# Patient Record
Sex: Male | Born: 1970 | Race: Black or African American | Hispanic: No | Marital: Married | Smoking: Smoker, current status unknown
Health system: Southern US, Community
[De-identification: ages and names within clinical notes are randomized; demographics above are authoritative.]

## PROBLEM LIST (undated history)

## (undated) DIAGNOSIS — C8590 Non-Hodgkin lymphoma, unspecified, unspecified site: Secondary | ICD-10-CM

## (undated) DIAGNOSIS — I10 Essential (primary) hypertension: Secondary | ICD-10-CM

## (undated) DIAGNOSIS — I2510 Atherosclerotic heart disease of native coronary artery without angina pectoris: Secondary | ICD-10-CM

## (undated) HISTORY — PX: CARDIAC SURGERY: SHX584

## (undated) HISTORY — PX: KNEE SURGERY: SHX244

---

## 2017-03-07 ENCOUNTER — Observation Stay (HOSPITAL_COMMUNITY)
Admission: EM | Admit: 2017-03-07 | Discharge: 2017-03-10 | Disposition: A | Discharge status: Home / Self Care | Payer: Self-pay | Attending: Family Medicine | Admitting: Family Medicine

## 2017-03-07 ENCOUNTER — Emergency Department (HOSPITAL_COMMUNITY): Payer: Self-pay

## 2017-03-07 ENCOUNTER — Encounter (HOSPITAL_COMMUNITY): Payer: Self-pay

## 2017-03-07 DIAGNOSIS — N179 Acute kidney failure, unspecified: Secondary | ICD-10-CM | POA: Insufficient documentation

## 2017-03-07 DIAGNOSIS — D649 Anemia, unspecified: Secondary | ICD-10-CM | POA: Insufficient documentation

## 2017-03-07 DIAGNOSIS — I2510 Atherosclerotic heart disease of native coronary artery without angina pectoris: Secondary | ICD-10-CM | POA: Diagnosis present

## 2017-03-07 DIAGNOSIS — R072 Precordial pain: Principal | ICD-10-CM | POA: Insufficient documentation

## 2017-03-07 DIAGNOSIS — I252 Old myocardial infarction: Secondary | ICD-10-CM | POA: Insufficient documentation

## 2017-03-07 DIAGNOSIS — I25119 Atherosclerotic heart disease of native coronary artery with unspecified angina pectoris: Secondary | ICD-10-CM

## 2017-03-07 DIAGNOSIS — Z8249 Family history of ischemic heart disease and other diseases of the circulatory system: Secondary | ICD-10-CM | POA: Insufficient documentation

## 2017-03-07 DIAGNOSIS — F17200 Nicotine dependence, unspecified, uncomplicated: Secondary | ICD-10-CM | POA: Insufficient documentation

## 2017-03-07 DIAGNOSIS — E876 Hypokalemia: Secondary | ICD-10-CM | POA: Diagnosis present

## 2017-03-07 DIAGNOSIS — Z880 Allergy status to penicillin: Secondary | ICD-10-CM | POA: Insufficient documentation

## 2017-03-07 DIAGNOSIS — Z79899 Other long term (current) drug therapy: Secondary | ICD-10-CM | POA: Insufficient documentation

## 2017-03-07 DIAGNOSIS — R079 Chest pain, unspecified: Secondary | ICD-10-CM | POA: Diagnosis present

## 2017-03-07 DIAGNOSIS — Z955 Presence of coronary angioplasty implant and graft: Secondary | ICD-10-CM | POA: Insufficient documentation

## 2017-03-07 DIAGNOSIS — E785 Hyperlipidemia, unspecified: Secondary | ICD-10-CM | POA: Insufficient documentation

## 2017-03-07 DIAGNOSIS — Z9114 Patient's other noncompliance with medication regimen: Secondary | ICD-10-CM | POA: Insufficient documentation

## 2017-03-07 DIAGNOSIS — Z720 Tobacco use: Secondary | ICD-10-CM | POA: Diagnosis present

## 2017-03-07 DIAGNOSIS — I10 Essential (primary) hypertension: Secondary | ICD-10-CM | POA: Diagnosis present

## 2017-03-07 HISTORY — DX: Atherosclerotic heart disease of native coronary artery without angina pectoris: I25.10

## 2017-03-07 HISTORY — DX: Essential (primary) hypertension: I10

## 2017-03-07 HISTORY — DX: Non-Hodgkin lymphoma, unspecified, unspecified site: C85.90

## 2017-03-07 LAB — APTT: aPTT: 37 seconds — ABNORMAL HIGH (ref 24–36)

## 2017-03-07 LAB — COMPREHENSIVE METABOLIC PANEL
ALBUMIN: 3.4 g/dL — AB (ref 3.5–5.0)
ALT: 28 U/L (ref 17–63)
AST: 36 U/L (ref 15–41)
Alkaline Phosphatase: 91 U/L (ref 38–126)
Anion gap: 8 (ref 5–15)
BILIRUBIN TOTAL: 0.5 mg/dL (ref 0.3–1.2)
BUN: 9 mg/dL (ref 6–20)
CHLORIDE: 105 mmol/L (ref 101–111)
CO2: 26 mmol/L (ref 22–32)
CREATININE: 1.3 mg/dL — AB (ref 0.61–1.24)
Calcium: 8.8 mg/dL — ABNORMAL LOW (ref 8.9–10.3)
GFR calc Af Amer: >60 mL/min (ref >60)
GFR calc non Af Amer: >60 mL/min (ref >60)
GLUCOSE: 97 mg/dL (ref 65–99)
Potassium: 3.3 mmol/L — ABNORMAL LOW (ref 3.5–5.1)
Sodium: 139 mmol/L (ref 135–145)
Total Protein: 6.5 g/dL (ref 6.5–8.1)

## 2017-03-07 LAB — PROTIME-INR
INR: 1.15
Prothrombin Time: 14.8 seconds (ref 11.4–15.2)

## 2017-03-07 LAB — CBC
HEMATOCRIT: 34.4 % — AB (ref 39.0–52.0)
HEMOGLOBIN: 11.2 g/dL — AB (ref 13.0–17.0)
MCH: 25.5 pg — AB (ref 26.0–34.0)
MCHC: 32.6 g/dL (ref 30.0–36.0)
MCV: 78.4 fL (ref 78.0–100.0)
Platelets: 165 10*3/uL (ref 150–400)
RBC: 4.39 MIL/uL (ref 4.22–5.81)
RDW: 14.6 % (ref 11.5–15.5)
WBC: 6.5 10*3/uL (ref 4.0–10.5)

## 2017-03-07 LAB — I-STAT TROPONIN, ED
TROPONIN I, POC: 0.01 ng/mL (ref 0.00–0.08)
Troponin i, poc: 0 ng/mL (ref 0.00–0.08)
Troponin i, poc: 0.01 ng/mL (ref 0.00–0.08)

## 2017-03-07 LAB — MAGNESIUM: MAGNESIUM: 2.2 mg/dL (ref 1.7–2.4)

## 2017-03-07 LAB — TROPONIN I: Troponin I: <0.03 ng/mL (ref <0.03)

## 2017-03-07 MED ORDER — ENOXAPARIN SODIUM 40 MG/0.4ML ~~LOC~~ SOLN
40.0000 mg | SUBCUTANEOUS | Status: DC
  Administered 2017-03-07 – 2017-03-09 (×3): 40 mg via SUBCUTANEOUS
  Filled 2017-03-07 (×3): qty 0.4

## 2017-03-07 MED ORDER — POTASSIUM CHLORIDE CRYS ER 20 MEQ PO TBCR
40.0000 meq | EXTENDED_RELEASE_TABLET | Freq: Once | ORAL | Status: AC
  Administered 2017-03-07: 40 meq via ORAL
  Filled 2017-03-07: qty 2

## 2017-03-07 MED ORDER — NICOTINE 21 MG/24HR TD PT24
21.0000 mg | MEDICATED_PATCH | Freq: Every day | TRANSDERMAL | Status: DC
  Administered 2017-03-08: 21 mg via TRANSDERMAL
  Filled 2017-03-07 (×2): qty 1

## 2017-03-07 MED ORDER — ONDANSETRON HCL 4 MG/2ML IJ SOLN: 4.0000 mg | Freq: Four times a day (QID) | INTRAMUSCULAR | Status: DC | PRN

## 2017-03-07 MED ORDER — ACETAMINOPHEN 325 MG PO TABS: 650.0000 mg | ORAL_TABLET | ORAL | Status: DC | PRN

## 2017-03-07 MED ORDER — ASPIRIN 81 MG PO CHEW: 324.0000 mg | CHEWABLE_TABLET | Freq: Once | ORAL | Status: AC

## 2017-03-07 MED ORDER — MORPHINE SULFATE (PF) 2 MG/ML IV SOLN
2.0000 mg | INTRAVENOUS | Status: DC | PRN
  Administered 2017-03-07 – 2017-03-09 (×3): 2 mg via INTRAVENOUS
  Filled 2017-03-07 (×3): qty 1

## 2017-03-07 MED ORDER — NITROGLYCERIN 0.4 MG SL SUBL
0.4000 mg | SUBLINGUAL_TABLET | SUBLINGUAL | Status: DC | PRN
  Administered 2017-03-07: 0.4 mg via SUBLINGUAL
  Filled 2017-03-07: qty 1

## 2017-03-07 MED ORDER — METOPROLOL TARTRATE 25 MG PO TABS
25.0000 mg | ORAL_TABLET | Freq: Two times a day (BID) | ORAL | Status: DC
  Administered 2017-03-07 – 2017-03-08 (×3): 25 mg via ORAL
  Filled 2017-03-07 (×3): qty 1

## 2017-03-07 MED ORDER — GI COCKTAIL ~~LOC~~
30.0000 mL | Freq: Four times a day (QID) | ORAL | Status: DC | PRN
  Administered 2017-03-08: 30 mL via ORAL
  Filled 2017-03-07 (×2): qty 30

## 2017-03-07 NOTE — ED Notes (Signed)
Care handoff to [place], [place]

## 2017-03-07 NOTE — ED Notes (Signed)
ED Provider at bedside. 

## 2017-03-07 NOTE — ED Notes (Signed)
PT reports pain comes and goes. He doesn't feel like it improved with nitro, but also notes that is was "worse a few minutes ago."

## 2017-03-07 NOTE — ED Triage Notes (Signed)
Pt was shopping in [place] and had chest pain that feels like his last heart attack. Pt has had 324mg  ASA and 1nitro that helped his pain

## 2017-03-07 NOTE — H&P (Signed)
Peter Madden BSW:[id number] DOB: 1970-10-08 DOA: 03/07/2017     PCP: Patient, No Pcp Per   Outpatient Specialists: none, Haven't seen one for years Patient coming from: home Lives With family   Chief Complaint: chest pain   HPI: Peter Madden is a 46 y.o. male with medical history significant of CAD HTN, tobacco abuse,      Presented with chest pain while shopping at [place]. Similar to Prior heart attack. He has been having intermittent pain for a bout 1 week but getting worse and today it "doubled me over".  Feels sharp like heart burn.lasts 5-10 sec at a time. And then lets off and then comes back. No Nausea. Some shortness of breath.  Patient states that he had had a heart attack in the past and had stents placed at [facility] in [place] but not [place]. No information found in Epic Chest pain resolved with Nitro. Occasionally keeps coming back.    Regarding pertinent Chronic problems:reports hx of MI and CAD sp stenting in 2014   IN ER:  Temp (24hrs), Avg:98.6 F (37 C), Min:98.6 F (37 C), Max:98.6 F (37 C)      on arrival  ED Triage Vitals  Enc Vitals Group     BP 03/07/17 1610 127/71     Pulse Rate 03/07/17 1610 88     Resp 03/07/17 1610 16     Temp 03/07/17 1610 98.6 F (37 C)     Temp Source 03/07/17 1610 Oral     SpO2 03/07/17 1559 100 %     Weight 03/07/17 1645 220 lb (99.8 kg)     Height 03/07/17 1645 6\' 1"  (1.854 m)     Head Circumference --      Peak Flow --      Pain Score 03/07/17 1610 5     Pain Loc --      Pain Edu? --      Excl. in [place]? --    RR 13 HR 78 BP 163/96 Trop 0.00-> 0.001  NA 139 K 3.3 Cr 1.30 alb 3.4  INr 1.15 WBC 6.5 Hg 11.2  CXR cardiomegaly Following Medications were ordered in ER: Medications  nitroGLYCERIN (NITROSTAT) SL tablet 0.4 mg (0.4 mg Sublingual Given 03/07/17 1644)  aspirin chewable tablet 324 mg (324 mg Oral Given by EMS 03/07/17 1645)     ER provider discussed case with:  Cardiology who felt EKG  changes were most likely secondary to early repolarization  Hospitalist was called for admission for chest pain evaluation  Review of Systems:    Pertinent positives include: chest pain, night sweats, blurred vision increased thirst and urination  Constitutional:  No weight loss,  Fevers, chills, fatigue, weight loss  HEENT:  No headaches, Difficulty swallowing,Tooth/dental problems,Sore throat,  No sneezing, itching, ear ache, nasal congestion, post nasal drip,  Cardio-vascular:  No Orthopnea, PND, anasarca, dizziness, palpitations.no Bilateral lower extremity swelling  GI:  No heartburn, indigestion, abdominal pain, nausea, vomiting, diarrhea, change in bowel habits, loss of appetite, melena, blood in stool, hematemesis Resp:  no shortness of breath at rest. No dyspnea on exertion, No excess mucus, no productive cough, No non-productive cough, No coughing up of blood.No change in color of mucus.No wheezing. Skin:  no rash or lesions. No jaundice GU:  no dysuria, change in color of urine, no urgency or frequency. No straining to urinate.  No flank pain.  Musculoskeletal:  No joint pain or no joint swelling. No decreased range of motion. No  back pain.  Psych:  No change in mood or affect. No depression or anxiety. No memory loss.  Neuro: no localizing neurological complaints, no tingling, no weakness, no double vision, no gait abnormality, no slurred speech, no confusion  As per HPI otherwise 10 point review of systems negative.   Past Medical History: Past Medical History:  Diagnosis Date  . Coronary artery disease   . Heart attack ([place]) 2014  . Lymphoma [facility])    Past Surgical History:  Procedure Laterality Date  . CARDIAC SURGERY       Social History:  Ambulatory  independently      reports that he has been smoking.  He has never used smokeless tobacco. He reports that he does not drink alcohol or use drugs.  Allergies:   Allergies  Allergen Reactions  .  Penicillins Hives    Has patient had a PCN reaction causing immediate rash, facial/tongue/throat swelling, SOB or lightheadedness with hypotension: Yes Has patient had a PCN reaction causing severe rash involving mucus membranes or skin necrosis: No Has patient had a PCN reaction that required hospitalization: No Has patient had a PCN reaction occurring within the last 10 years: No If all of the above answers are "NO", then may proceed with Cephalosporin use.       Family History:   Family History  Problem Relation Age of Onset  . Diabetes Mother   . CAD Father   . Testicular cancer Father   . CAD Other   . Stroke Other   . Brain cancer Other     Medications: Prior to Admission medications   Medication Sig Start Date End Date Taking? Authorizing Provider  albuterol (PROVENTIL HFA;VENTOLIN HFA) 108 (90 Base) MCG/ACT inhaler Inhale 2 puffs into the lungs every 6 (six) hours as needed for wheezing or shortness of breath.   Yes [provider]  lisinopril (PRINIVIL,ZESTRIL) 20 MG tablet Take 20 mg by mouth daily.   Yes [provider]  Multiple Vitamin (MULTIVITAMIN WITH MINERALS) TABS tablet Take 1 tablet by mouth daily.   Yes [provider]    Physical Exam: Patient Vitals for the past 24 hrs:  BP Temp Temp src Pulse Resp SpO2 Height Weight  03/07/17 1830 (!) 142/84 - - - 13 - - -  03/07/17 1815 (!) 148/88 - - - 14 - - -  03/07/17 1800 129/86 - - - 13 - - -  03/07/17 1745 (!) 157/93 - - - 14 - - -  03/07/17 1741 - - - - - 97 % - -  03/07/17 1730 (!) 154/96 - - - (!) 24 - - -  03/07/17 1715 (!) 139/94 - - - 10 - - -  03/07/17 1700 (!) 127/95 - - - 12 - - -  03/07/17 1645 - - - - - - 6\' 1"  (1.854 m) 99.8 kg (220 lb)  03/07/17 1645 126/80 - - - 18 - - -  03/07/17 1632 - - - - (!) 21 - - -  03/07/17 1631 (!) 143/84 - - - - - - -  03/07/17 1610 127/71 98.6 F (37 C) Oral 88 16 100 % - -  03/07/17 1559 - - - - - 100 % - -    1. General:  in No  Acute distress 2. Psychological: Alert and  Oriented 3. Head/ENT:   Moist   Mucous Membranes  Head Non traumatic, neck supple                            Poor Dentition 4. SKIN:  decreased Skin turgor,  Skin clean Dry and intact no rash 5. Heart: Regular rate and rhythm no Murmur, Rub or gallop 6. Lungs:   no wheezes or crackles   7. Abdomen: Soft, non-tender, Non distended 8. Lower extremities: no clubbing, cyanosis, or edema 9. Neurologically Grossly intact, moving all 4 extremities equally   10. MSK: Normal range of motion, pain not reproducible by palpation   body mass index is 29.03 kg/m.  Labs on Admission:   Labs on Admission: I have personally reviewed following labs and imaging studies  CBC:  Recent Labs Lab 03/07/17 1613  WBC 6.5  HGB 11.2*  HCT 34.4*  MCV 78.4  PLT 824   Basic Metabolic Panel:  Recent Labs Lab 03/07/17 1622  NA 139  K 3.3*  CL 105  CO2 26  GLUCOSE 97  BUN 9  CREATININE 1.30*  CALCIUM 8.8*   GFR: Estimated Creatinine Clearance: 89.2 mL/min (A) (by C-G formula based on SCr of 1.3 mg/dL (H)). Liver Function Tests:  Recent Labs Lab 03/07/17 1622  AST 36  ALT 28  ALKPHOS 91  BILITOT 0.5  PROT 6.5  ALBUMIN 3.4*   No results for input(s): LIPASE, AMYLASE in the last 168 hours. No results for input(s): AMMONIA in the last 168 hours. Coagulation Profile:  Recent Labs Lab 03/07/17 1622  INR 1.15   Cardiac Enzymes: No results for input(s): CKTOTAL, CKMB, CKMBINDEX, TROPONINI in the last 168 hours. BNP (last 3 results) No results for input(s): PROBNP in the last 8760 hours. HbA1C: No results for input(s): HGBA1C in the last 72 hours. CBG: No results for input(s): GLUCAP in the last 168 hours. Lipid Profile: No results for input(s): CHOL, HDL, LDLCALC, TRIG, CHOLHDL, LDLDIRECT in the last 72 hours. Thyroid Function Tests: No results for input(s): TSH, T4TOTAL, FREET4, T3FREE, THYROIDAB in the last 72  hours. Anemia Panel: No results for input(s): VITAMINB12, FOLATE, FERRITIN, TIBC, IRON, RETICCTPCT in the last 72 hours. Urine analysis: No results found for: COLORURINE, APPEARANCEUR, LABSPEC, PHURINE, GLUCOSEU, HGBUR, BILIRUBINUR, KETONESUR, PROTEINUR, UROBILINOGEN, NITRITE, LEUKOCYTESUR Sepsis Labs: @LABRCNTIP (procalcitonin:4,lacticidven:4) )No results found for this or any previous visit (from the past 240 hour(s)).     UA  not ordered  No results found for: HGBA1C  Estimated Creatinine Clearance: 89.2 mL/min (A) (by C-G formula based on SCr of 1.3 mg/dL (H)).  BNP (last 3 results) No results for input(s): PROBNP in the last 8760 hours.   ECG REPORT  Independently reviewed Rate:81  Rhythm: Sinus ST&T Change: Early repolarization QTC 457  Filed Weights   03/07/17 1645  Weight: 99.8 kg (220 lb)     Cultures: No results found for: SDES, SPECREQUEST, CULT, REPTSTATUS   Radiological Exams on Admission: Dg Chest Portable 1 View  Result Date: 03/07/2017 CLINICAL DATA:  Chest pain. EXAM: PORTABLE CHEST 1 VIEW COMPARISON:  No prior. FINDINGS: Thoracic aorta is tortuous. Mild cardiomegaly. Normal pulmonary vascularity. No focal infiltrate. No pleural effusion or pneumothorax. Left costophrenic angle is not completely imaged. No acute bony abnormality. IMPRESSION: Thoracic aorta is slightly tortuous. Borderline cardiomegaly. No pulmonary venous congestion. No acute pulmonary disease. Electronically Signed   By: Marcello Moores  Register   On: 03/07/2017 17:28    Chart has been reviewed    Assessment/Plan  46 y.o. male  with medical history significant of CAD HTN, tobacco abuse,   Admitted for chest pain evaluation  Present on Admission: . CAD (coronary artery disease) - continue aspirin, will benefit from further work up . Chest pain - - given risk factors will admit, monitor on telemetry, cycle cardiac enzymes, obtain serial ECG. Further risk stratify with lipid panel, hgA1C, obtain  TSH. Make sure patient is on Aspirin. Further treatment based on the currently pending results.   . Hypokalemia replace and check mg level HTN- poorly controled, he has not been taking his medications as prescribed.  . Tobacco abuse - spoke about importance of quitting AKI - elevated creatinine unsure what the baseline is, will gently rehydrate and follow Anemia- check anemia panel in AM,  Other plan as per orders.  DVT prophylaxis:  Lovenox     Code Status:  FULL CODE   as per patient    Family Communication:   Family not at  Bedside   Disposition Plan:     To home once workup is complete and patient is stable                        Consults called: email cardiology   Admission status:    obs   Level of care     tele          I have spent a total of 56 min on this admission    Brinleigh Tew 03/07/2017, 8:04 PM    [facility]  Pager [phone]   after 2 AM please page floor coverage PA If 7AM-7PM, please contact the day team taking care of the patient  [email or web address]  Password [email or web address]

## 2017-03-07 NOTE — ED Provider Notes (Signed)
[place] DEPT Provider Note   CSN: [id number] Arrival date & time: 03/07/17  1558     History   Chief Complaint Chief Complaint  Patient presents with  . Chest Pain    HPI Peter Madden is a 46 y.o. male.  HPI  46 year old man reports history of MI presents today complaining of substernal chest pain. He states he has had some chest discomfort that he thought was indigestion over the past week. Has waxed and waned. It comes and goes he is unable to give me a timeframe. He states it began again last night and worsened this afternoon. The worst is 9 out of 10 and is currently 6-7 out of 10. He describes some dyspnea but no diaphoresis. He states this is similar to previous MI. Aspirin and nitroglycerin in route with some improvement of his symptoms. He states his previous MI and was treated at [facility]. He states that he has since moved. He denies any cough, fever, history of PE or DVT or leg swelling currently. He is a smoker but denies any drug use.  Past Medical History:  Diagnosis Date  . Coronary artery disease   . Heart attack ([place]) 2014  . Lymphoma ([place])     There are no active problems to display for this patient.   Past Surgical History:  Procedure Laterality Date  . CARDIAC SURGERY         Home Medications    Prior to Admission medications   Medication Sig Start Date End Date Taking? Authorizing Provider  albuterol (PROVENTIL HFA;VENTOLIN HFA) 108 (90 Base) MCG/ACT inhaler Inhale 2 puffs into the lungs every 6 (six) hours as needed for wheezing or shortness of breath.   Yes [provider]  lisinopril (PRINIVIL,ZESTRIL) 20 MG tablet Take 20 mg by mouth daily.   Yes [provider]  Multiple Vitamin (MULTIVITAMIN WITH MINERALS) TABS tablet Take 1 tablet by mouth daily.   Yes [provider]    Family History No family history on file.  Social History Social History  Substance Use Topics  . Smoking status: Smoker,  Current Status Unknown  . Smokeless tobacco: Never Used  . Alcohol use No     Allergies   Penicillins   Review of Systems Review of Systems  All other systems reviewed and are negative.    Physical Exam Updated Vital Signs BP (!) 157/93   Pulse 88   Temp 98.6 F (37 C) (Oral)   Resp 14   Ht 1.854 m (6\' 1" )   Wt 99.8 kg (220 lb)   SpO2 97%   BMI 29.03 kg/m   Physical Exam  Constitutional: He is oriented to person, place, and time. He appears well-developed and well-nourished.  HENT:  Head: Normocephalic and atraumatic.  Right Ear: External ear normal.  Left Ear: External ear normal.  Nose: Nose normal.  Mouth/Throat: Oropharynx is clear and moist.  Eyes: Conjunctivae and EOM are normal. Pupils are equal, round, and reactive to light.  Neck: Normal range of motion. Neck supple.  Cardiovascular: Normal rate, regular rhythm, normal heart sounds and intact distal pulses.   Pulmonary/Chest: Effort normal and breath sounds normal.  Abdominal: Soft. Bowel sounds are normal.  Musculoskeletal: Normal range of motion.  Neurological: He is alert and oriented to person, place, and time. He has normal reflexes.  Skin: Skin is warm and dry.  Psychiatric: He has a normal mood and affect. His behavior is normal. Judgment and thought content normal.  Nursing note  and vitals reviewed.    ED Treatments / Results  Labs (all labs ordered are listed, but only abnormal results are displayed) Labs Reviewed  CBC - Abnormal; Notable for the following:       Result Value   Hemoglobin 11.2 (*)    HCT 34.4 (*)    MCH 25.5 (*)    All other components within normal limits  COMPREHENSIVE METABOLIC PANEL - Abnormal; Notable for the following:    Potassium 3.3 (*)    Creatinine, Ser 1.30 (*)    Calcium 8.8 (*)    Albumin 3.4 (*)    All other components within normal limits  APTT - Abnormal; Notable for the following:    aPTT 37 (*)    All other components within normal limits    PROTIME-INR  I-STAT TROPOININ, ED  CBG MONITORING, ED  I-STAT TROPOININ, ED    EKG  EKG Interpretation None       Radiology Dg Chest Portable 1 View  Result Date: 03/07/2017 CLINICAL DATA:  Chest pain. EXAM: PORTABLE CHEST 1 VIEW COMPARISON:  No prior. FINDINGS: Thoracic aorta is tortuous. Mild cardiomegaly. Normal pulmonary vascularity. No focal infiltrate. No pleural effusion or pneumothorax. Left costophrenic angle is not completely imaged. No acute bony abnormality. IMPRESSION: Thoracic aorta is slightly tortuous. Borderline cardiomegaly. No pulmonary venous congestion. No acute pulmonary disease. Electronically Signed   By: Marcello Moores  Register   On: 03/07/2017 17:28    Procedures Procedures (including critical care time)  Medications Ordered in ED Medications  nitroGLYCERIN (NITROSTAT) SL tablet 0.4 mg (0.4 mg Sublingual Given 03/07/17 1644)  aspirin chewable tablet 324 mg (324 mg Oral Given by EMS 03/07/17 1645)     Initial Impression / Assessment and Plan / ED Course  I have reviewed the triage vital signs and the nursing notes.  Pertinent labs & imaging results that were available during my care of the patient were reviewed by me and considered in my medical decision making (see chart for details).    After initial evaluation, EKG discussed with Delos Haring PA. She reports that she discussed results with Dr. Johnsie Cancel who reviewed EKG. He feels that this is early re-polarization.   Discussed with Dr. detected and will admit for ongoing evaluation of chest pain Final Clinical Impressions(s) / ED Diagnoses   Final diagnoses:  Chest pain, unspecified type    New Prescriptions New Prescriptions   No medications on file     Pattricia Boss, MD 03/07/17 1944

## 2017-03-08 ENCOUNTER — Telehealth: Payer: Self-pay | Admitting: Physician Assistant

## 2017-03-08 ENCOUNTER — Encounter (HOSPITAL_COMMUNITY): Payer: Self-pay | Admitting: Physician Assistant

## 2017-03-08 ENCOUNTER — Observation Stay (HOSPITAL_BASED_OUTPATIENT_CLINIC_OR_DEPARTMENT_OTHER): Payer: Self-pay

## 2017-03-08 DIAGNOSIS — R072 Precordial pain: Secondary | ICD-10-CM

## 2017-03-08 DIAGNOSIS — R079 Chest pain, unspecified: Secondary | ICD-10-CM

## 2017-03-08 LAB — ECHOCARDIOGRAM COMPLETE
AVLVOTPG: 3 mmHg
CHL CUP DOP CALC LVOT VTI: 21.4 cm
CHL CUP MV DEC (S): 290
E/e' ratio: 10.3
EWDT: 290 ms
FS: 35 % (ref 28–44)
HEIGHTINCHES: 73 in
IV/PV OW: 1
LA diam index: 1.76 cm/m2
LA vol A4C: 66.7 ml
LA vol: 68.4 mL
LASIZE: 38 mm
LAVOLIN: 31.7 mL/m2
LDCA: 4.52 cm2
LEFT ATRIUM END SYS DIAM: 38 mm
LV E/e' medial: 10.3
LV TDI E'LATERAL: 10.1
LV TDI E'MEDIAL: 6.74
LV e' LATERAL: 10.1 cm/s
LVEEAVG: 10.3
LVOT SV: 97 mL
LVOTD: 24 mm
LVOTPV: 88.7 cm/s
Lateral S' vel: 12 cm/s
MV Peak grad: 4 mmHg
MV pk A vel: 87.8 m/s
MV pk E vel: 104 m/s
PW: 9 mm — AB (ref 0.6–1.1)
TAPSE: 20.9 mm
WEIGHTICAEL: 3228.8 [oz_av]

## 2017-03-08 LAB — RETICULOCYTES
RBC.: 4.62 MIL/uL (ref 4.22–5.81)
RETIC COUNT ABSOLUTE: 27.7 10*3/uL (ref 19.0–186.0)
RETIC CT PCT: 0.6 % (ref 0.4–3.1)

## 2017-03-08 LAB — LIPID PANEL
CHOLESTEROL: 216 mg/dL — AB (ref 0–200)
HDL: 34 mg/dL — ABNORMAL LOW (ref >40)
LDL Cholesterol: 147 mg/dL — ABNORMAL HIGH (ref 0–99)
Total CHOL/HDL Ratio: 6.4 RATIO
Triglycerides: 174 mg/dL — ABNORMAL HIGH (ref <150)
VLDL: 35 mg/dL (ref 0–40)

## 2017-03-08 LAB — VITAMIN B12: VITAMIN B 12: 149 pg/mL — AB (ref 180–914)

## 2017-03-08 LAB — IRON AND TIBC
IRON: 52 ug/dL (ref 45–182)
SATURATION RATIOS: 19 % (ref 17.9–39.5)
TIBC: 273 ug/dL (ref 250–450)
UIBC: 221 ug/dL

## 2017-03-08 LAB — FOLATE: FOLATE: 11.1 ng/mL (ref >5.9)

## 2017-03-08 LAB — FERRITIN: FERRITIN: 88 ng/mL (ref 24–336)

## 2017-03-08 LAB — TROPONIN I

## 2017-03-08 LAB — TSH: TSH: 2.694 u[IU]/mL (ref 0.350–4.500)

## 2017-03-08 LAB — HIV ANTIBODY (ROUTINE TESTING W REFLEX): HIV SCREEN 4TH GENERATION: NONREACTIVE

## 2017-03-08 MED ORDER — SODIUM CHLORIDE 0.9% FLUSH
3.0000 mL | Freq: Two times a day (BID) | INTRAVENOUS | Status: DC
  Administered 2017-03-08: 3 mL via INTRAVENOUS

## 2017-03-08 MED ORDER — ATORVASTATIN CALCIUM 80 MG PO TABS
80.0000 mg | ORAL_TABLET | Freq: Every day | ORAL | Status: DC
  Administered 2017-03-08 – 2017-03-09 (×2): 80 mg via ORAL
  Filled 2017-03-08 (×2): qty 1

## 2017-03-08 MED ORDER — LISINOPRIL 10 MG PO TABS
10.0000 mg | ORAL_TABLET | Freq: Every day | ORAL | Status: DC
  Administered 2017-03-08 – 2017-03-10 (×2): 10 mg via ORAL
  Filled 2017-03-08 (×2): qty 1

## 2017-03-08 MED ORDER — SODIUM CHLORIDE 0.9% FLUSH
3.0000 mL | INTRAVENOUS | Status: DC | PRN
  Administered 2017-03-08: 3 mL via INTRAVENOUS

## 2017-03-08 NOTE — Telephone Encounter (Signed)
Erroneous encounter

## 2017-03-08 NOTE — Progress Notes (Signed)
  Echocardiogram 2D Echocardiogram has been performed.  Peter Madden 03/08/2017, 5:57 PM

## 2017-03-08 NOTE — Progress Notes (Signed)
PROGRESS NOTE    Bradshaw Minihan  JME:[id number] DOB: 1971/07/09 DOA: 03/07/2017 PCP: Patient, No Pcp Per    Brief Narrative:  46 y.o. male with medical history significant of CAD HTN, tobacco abuse, presented with chest pain while shopping at [facility]. Similar to Prior heart attack  Admitted and cardiology consulted  Assessment & Plan:   Active Problems:   CAD (coronary artery disease)  Chest pain -Plan is for stress test next a.m. Unable to do it today as patient drank fluids prior to procedure. - Continue statin beta blocker    Hypokalemia - Mild, will obtain BMP next a.m. order placed    Tobacco abuse - We'll continue to encourage tobacco cessation    Essential hypertension - Stable on lisinopril and metoprolol  DVT prophylaxis: Lovenox Code Status: Full Family Communication: Discussed with patient and male significant other at bedside Disposition Plan: pending further work up   Consultants:   Cardiology   Procedures: None   Antimicrobials: None   Subjective: Pt has no new complaints. No acute issues overnight. Having chest discomfort that is on and off  Objective: Vitals:   03/07/17 2030 03/07/17 2057 03/08/17 0500 03/08/17 1332  BP: (!) 141/97 (!) 161/95 (!) 162/87 134/77  Pulse: 74 73 (!) 54 60  Resp: 15 18 16    Temp:  97.6 F (36.4 C) 97.7 F (36.5 C) 98.5 F (36.9 C)  TempSrc:  Oral Oral Oral  SpO2: 99% 98% 99% 100%  Weight:  91.5 kg (201 lb 12.8 oz)    Height:  6\' 1"  (1.854 m)      Intake/Output Summary (Last 24 hours) at 03/08/17 1506 Last data filed at 03/07/17 2100  Gross per 24 hour  Intake              420 ml  Output                0 ml  Net              420 ml   Filed Weights   03/07/17 1645 03/07/17 2057  Weight: 99.8 kg (220 lb) 91.5 kg (201 lb 12.8 oz)    Examination:  General exam: Appears calm and comfortable, in nad. Respiratory system: Clear to auscultation. Respiratory effort normal. Cardiovascular system: S1 &  S2 heard, RRR.  Gastrointestinal system: Abdomen is nondistended, soft and nontender. No organomegaly or masses felt. Normal bowel sounds heard. Central nervous system: Alert and oriented. No focal neurological deficits. Extremities: Symmetric 5 x 5 power. Skin: No rashes, lesions or ulcers, on limited exam. Psychiatry:  Mood & affect appropriate.   Data Reviewed: I have personally reviewed following labs and imaging studies  CBC:  Recent Labs Lab 03/07/17 1613  WBC 6.5  HGB 11.2*  HCT 34.4*  MCV 78.4  PLT 229   Basic Metabolic Panel:  Recent Labs Lab 03/07/17 1622 03/07/17 1933  NA 139  --   K 3.3*  --   CL 105  --   CO2 26  --   GLUCOSE 97  --   BUN 9  --   CREATININE 1.30*  --   CALCIUM 8.8*  --   MG  --  2.2   GFR: Estimated Creatinine Clearance: 81.1 mL/min (A) (by C-G formula based on SCr of 1.3 mg/dL (H)). Liver Function Tests:  Recent Labs Lab 03/07/17 1622  AST 36  ALT 28  ALKPHOS 91  BILITOT 0.5  PROT 6.5  ALBUMIN 3.4*   No results for  input(s): LIPASE, AMYLASE in the last 168 hours. No results for input(s): AMMONIA in the last 168 hours. Coagulation Profile:  Recent Labs Lab 03/07/17 1622  INR 1.15   Cardiac Enzymes:  Recent Labs Lab 03/07/17 1933 03/08/17 0050 03/08/17 0724  TROPONINI <0.03 <0.03 <0.03   BNP (last 3 results) No results for input(s): PROBNP in the last 8760 hours. HbA1C: No results for input(s): HGBA1C in the last 72 hours. CBG: No results for input(s): GLUCAP in the last 168 hours. Lipid Profile:  Recent Labs  03/08/17 0050  CHOL 216*  HDL 34*  LDLCALC 147*  TRIG 174*  CHOLHDL 6.4   Thyroid Function Tests:  Recent Labs  03/08/17 0050  TSH 2.694   Anemia Panel:  Recent Labs  03/08/17 0050 03/08/17 0724  VITAMINB12  --  149*  FOLATE 11.1  --   FERRITIN  --  88  TIBC  --  273  IRON  --  52  RETICCTPCT  --  0.6   Sepsis Labs: No results for input(s): PROCALCITON, LATICACIDVEN in the last  168 hours.  No results found for this or any previous visit (from the past 240 hour(s)).       Radiology Studies: Dg Chest Portable 1 View  Result Date: 03/07/2017 CLINICAL DATA:  Chest pain. EXAM: PORTABLE CHEST 1 VIEW COMPARISON:  No prior. FINDINGS: Thoracic aorta is tortuous. Mild cardiomegaly. Normal pulmonary vascularity. No focal infiltrate. No pleural effusion or pneumothorax. Left costophrenic angle is not completely imaged. No acute bony abnormality. IMPRESSION: Thoracic aorta is slightly tortuous. Borderline cardiomegaly. No pulmonary venous congestion. No acute pulmonary disease. Electronically Signed   By: Marcello Moores  Register   On: 03/07/2017 17:28        Scheduled Meds: . atorvastatin  80 mg Oral q1800  . enoxaparin (LOVENOX) injection  40 mg Subcutaneous Q24H  . lisinopril  10 mg Oral Daily  . metoprolol tartrate  25 mg Oral BID  . nicotine  21 mg Transdermal Daily   Continuous Infusions:   LOS: 0 days    Time spent: > 15 minutes  Velvet Bathe, MD [facility] Pager [phone]  If 7PM-7AM, please contact night-coverage [email or web address] Password [email or web address] 03/08/2017, 3:06 PM

## 2017-03-08 NOTE — Consult Note (Signed)
CARDIOLOGY CONSULT NOTE   Patient ID: Peter Madden MRN: [id number] DOB/AGE: 01/12/1971 46 y.o.  Admit date: 03/07/2017  Requesting Physician: Dr. Roel Cluck Primary Physician:   Patient, No Pcp Per Primary Cardiologist: New Reason for Consultation: chest pain  Peter Madden is a 46 y.o. male who is being seen today for the evaluation of chest pain at the request of Dr. Roel Cluck .   HPI: Peter Madden is a 46 y.o. male with a history of HTN, tobacco abuse, CAD with MI/stenting in 2014 (records unavailable), and medical non compliance who presented to [facility] ED on 03/07/17 with chest pain.  The patient reports having previously had a myocardial infarction in 2014 and being treated with a stent somewhere in [place]. There is no history on him in Care Everywhere. He is unsure what hospital he was treated at or who his doctor was. Apparently followed with a cardiologist for a short while and Peter Madden, [place].  He works doing odd jobs Clinical research associate. He is very active on the job. He denies any history of exertional chest pain or shortness of breath. He says he does not follow-up with any doctors regularly but has been taking his lisinopril.   He was in his usual state of health until last week when he started noticing intermittent reflux type symptoms and stabbing chest pain. These episodes were not related to exertion. He had some associated shortness of breath but no nausea. He's been having night sweats but no diaphoresis associated with chest pain. He did have some relief with nitroglycerin. Apparently his symptoms are identical to his presentation in 2014 when he had heart attack and stent placement.  He is currently chest pain-free. He denies lower short edema, orthopnea or PND. He occasionally has some dizziness but no syncope. He intermittently gets palpitations that are short lived and self resolve.    Past Medical History:  Diagnosis Date  . Coronary artery  disease   . Heart attack ([place]) 2014  . Lymphoma [facility])      Past Surgical History:  Procedure Laterality Date  . CARDIAC SURGERY      Allergies  Allergen Reactions  . Penicillins Hives    Has patient had a PCN reaction causing immediate rash, facial/tongue/throat swelling, SOB or lightheadedness with hypotension: Yes Has patient had a PCN reaction causing severe rash involving mucus membranes or skin necrosis: No Has patient had a PCN reaction that required hospitalization: No Has patient had a PCN reaction occurring within the last 10 years: No If all of the above answers are "NO", then may proceed with Cephalosporin use.    I have reviewed the patient's current medications . enoxaparin (LOVENOX) injection  40 mg Subcutaneous Q24H  . metoprolol tartrate  25 mg Oral BID  . nicotine  21 mg Transdermal Daily    acetaminophen, gi cocktail, morphine injection, nitroGLYCERIN, ondansetron (ZOFRAN) IV  Prior to Admission medications   Medication Sig Start Date End Date Taking? Authorizing Provider  albuterol (PROVENTIL HFA;VENTOLIN HFA) 108 (90 Base) MCG/ACT inhaler Inhale 2 puffs into the lungs every 6 (six) hours as needed for wheezing or shortness of breath.   Yes [provider]  lisinopril (PRINIVIL,ZESTRIL) 20 MG tablet Take 20 mg by mouth daily.   Yes [provider]  Multiple Vitamin (MULTIVITAMIN WITH MINERALS) TABS tablet Take 1 tablet by mouth daily.   Yes [provider]     Social History   Social History  . Marital status: Married  Spouse name: N/A  . Number of children: N/A  . Years of education: N/A   Occupational History  . Not on file.   Social History Main Topics  . Smoking status: Smoker, Current Status Unknown  . Smokeless tobacco: Never Used  . Alcohol use No  . Drug use: No  . Sexual activity: Yes   Other Topics Concern  . Not on file   Social History Narrative  . No narrative on file    Family Status  Relation  Status  . Mother (Not Specified)  . Father (Not Specified)  . Other (Not Specified)   Family History  Problem Relation Age of Onset  . Diabetes Mother   . CAD Father   . Testicular cancer Father   . CAD Other   . Stroke Other   . Brain cancer Other     ROS:  Full 14 point review of systems complete and found to be negative unless listed above.  Physical Exam: Blood pressure (!) 162/87, pulse (!) 54, temperature 97.7 F (36.5 C), temperature source Oral, resp. rate 16, height 6\' 1"  (1.854 m), weight 201 lb 12.8 oz (91.5 kg), SpO2 99 %.  General: Well developed, well nourished, male in no acute distress Head: Eyes PERRLA, No xanthomas.   Normocephalic and atraumatic, oropharynx without edema or exudate.  Lungs: CTAB Heart: HRRR S1 S2, no rub/gallop, Heart regular rate and rhythm with S1, S2 no murmur. pulses are 2+ extrem.   Neck: No carotid bruits. No lymphadenopathy. no  JVD. Abdomen: Bowel sounds present, abdomen soft and non-tender without masses or hernias noted. Msk:  No spine or cva tenderness. No weakness, no joint deformities or effusions. Extremities: No clubbing or cyanosis. No LE edema.  Neuro: Alert and oriented X 3. No focal deficits noted. Psych:  Good affect, responds appropriately Skin: No rashes or lesions noted.  Labs:   Lab Results  Component Value Date   WBC 6.5 03/07/2017   HGB 11.2 (L) 03/07/2017   HCT 34.4 (L) 03/07/2017   MCV 78.4 03/07/2017   PLT 165 03/07/2017    Recent Labs  03/07/17 1622  INR 1.15    Recent Labs Lab 03/07/17 1622  NA 139  K 3.3*  CL 105  CO2 26  BUN 9  CREATININE 1.30*  CALCIUM 8.8*  PROT 6.5  BILITOT 0.5  ALKPHOS 91  ALT 28  AST 36  GLUCOSE 97  ALBUMIN 3.4*   Magnesium  Date Value Ref Range Status  03/07/2017 2.2 1.7 - 2.4 mg/dL Final    Recent Labs  03/07/17 1933 03/08/17 0050  TROPONINI <0.03 <0.03    Recent Labs  03/07/17 1647 03/07/17 1903  TROPIPOC 0.01 0.01   No results found for:  PROBNP Lab Results  Component Value Date   CHOL 216 (H) 03/08/2017   HDL 34 (L) 03/08/2017   LDLCALC 147 (H) 03/08/2017   TRIG 174 (H) 03/08/2017   No results found for: DDIMER No results found for: LIPASE, AMYLASE TSH  Date/Time Value Ref Range Status  03/08/2017 12:50 AM 2.694 0.350 - 4.500 uIU/mL Final    Comment:    Performed by a 3rd Generation assay with a functional sensitivity of <=0.01 uIU/mL.   Folate  Date/Time Value Ref Range Status  03/08/2017 12:50 AM 11.1 >5.9 ng/mL Final    Echo: none  ECG:  NSR with LVH - personally reviewed  TELE: NSR With PACs and some bradycardia - personally reviewed  Radiology:  Dg Chest Portable 1  View  Result Date: 03/07/2017 CLINICAL DATA:  Chest pain. EXAM: PORTABLE CHEST 1 VIEW COMPARISON:  No prior. FINDINGS: Thoracic aorta is tortuous. Mild cardiomegaly. Normal pulmonary vascularity. No focal infiltrate. No pleural effusion or pneumothorax. Left costophrenic angle is not completely imaged. No acute bony abnormality. IMPRESSION: Thoracic aorta is slightly tortuous. Borderline cardiomegaly. No pulmonary venous congestion. No acute pulmonary disease. Electronically Signed   By: Marcello Moores  Register   On: 03/07/2017 17:28    ASSESSMENT AND PLAN:    Active Problems:   CAD (coronary artery disease)   Hypokalemia   Chest pain   Tobacco abuse   Essential hypertension  Peter Madden is a 46 y.o. male with a history of HTN, tobacco abuse, CAD with MI/stenting in 2014 (records unavailable), and medical non compliance who presented to [facility] ED on 03/07/17 with chest pain.  Chest pain: chest pain is atypical in that it stabbing and not related to exertion. However, it is identical to the chest pain that he had during his reported MI in 2014. We have no records on him. There is no objective evidence of ischemia at this point. He has negative cardiac enzymes and ECG with no acute ST or T-wave changes. Plan was for nuclear stress test this morning,  however patient drank a glass of orange juice after being asked not to. So we will make him NPO after midnight and plan for nuclear stress test tomorrow morning.   HTN: blood pressure is poorly controlled. He reports compliance with his lisinopril at home. He was started on Lopressor 25 mg BID upon admission. Lisinopril is being held secondary to AKI with a creatinine of 1.3. However his GFR is normal so I think this can be resumed at a lower dose (10 mg)   CAD: not on a daily ASA 81mg  (he only takes an aspirin when his wife annoys him to take one). Not on a statin for unclear reasons. Will start atorva 80mg  daily  HLD: LDL is 147. It goal under 70 given history of CAD.  Hypokalemia: he has received supplementation  Tobacco abuse: Counseled on the importance of complete cessation.  Signed: Angelena Form, PA-C 03/08/2017 7:37 AM  Pager [phone]  Cardiology attending  Patient seen and examined. I have reviewed the findings as documented above by Angelena Form PA-C and concur. The patient is a 46 year old man with multiple cardiac risk factors and known coronary disease by his report who presents with both typical and atypical chest pain. He has ruled out for MI. Unfortunately his stress test could not be performed today. His exam demonstrates a well-appearing 46 year old man in no distress. Cardiovascular exam revealed regular rate and rhythm and lungs are clear bilaterally extremities demonstrated no edema. Telemetry demonstrates sinus rhythm and his EKG demonstrates sinus rhythm with no acute ST-T wave abnormalities. He does have left ventricular hypertrophy by voltage criteria. Assessment and plan 1. Chest pain with typical and atypical features in a patient with known disease - we'll plan on having the patient undergo exercise treadmill stress testing. If he is intermediate risk or high risk, left heart catheterization would be recommended. He will continue his current medications. 2.  Hypertension - his blood pressure is elevated. He is currently on an ACE inhibitor. Unfortunately he is bradycardic making data blocker therapy relatively contraindicated. Amlodipine would be a consideration.

## 2017-03-09 ENCOUNTER — Observation Stay (HOSPITAL_BASED_OUTPATIENT_CLINIC_OR_DEPARTMENT_OTHER): Payer: Self-pay

## 2017-03-09 DIAGNOSIS — Z720 Tobacco use: Secondary | ICD-10-CM

## 2017-03-09 DIAGNOSIS — I10 Essential (primary) hypertension: Secondary | ICD-10-CM

## 2017-03-09 DIAGNOSIS — R079 Chest pain, unspecified: Secondary | ICD-10-CM

## 2017-03-09 LAB — NM MYOCAR MULTI W/SPECT W/WALL MOTION / EF
CHL CUP RESTING HR STRESS: 59 {beats}/min
CSEPPHR: 80 {beats}/min
MPHR: 175 {beats}/min
Percent HR: 48 %

## 2017-03-09 LAB — BASIC METABOLIC PANEL
Anion gap: 7 (ref 5–15)
BUN: 12 mg/dL (ref 6–20)
CHLORIDE: 101 mmol/L (ref 101–111)
CO2: 29 mmol/L (ref 22–32)
CREATININE: 1.3 mg/dL — AB (ref 0.61–1.24)
Calcium: 9 mg/dL (ref 8.9–10.3)
GFR calc non Af Amer: >60 mL/min (ref >60)
Glucose, Bld: 95 mg/dL (ref 65–99)
Potassium: 3.8 mmol/L (ref 3.5–5.1)
Sodium: 137 mmol/L (ref 135–145)

## 2017-03-09 LAB — HEMOGLOBIN A1C
HEMOGLOBIN A1C: 5.5 % (ref 4.8–5.6)
MEAN PLASMA GLUCOSE: 111 mg/dL

## 2017-03-09 MED ORDER — REGADENOSON 0.4 MG/5ML IV SOLN
0.4000 mg | Freq: Once | INTRAVENOUS | Status: AC
  Administered 2017-03-09: 0.4 mg via INTRAVENOUS
  Filled 2017-03-09: qty 5

## 2017-03-09 MED ORDER — AMLODIPINE BESYLATE 10 MG PO TABS
10.0000 mg | ORAL_TABLET | Freq: Every day | ORAL | Status: DC
  Administered 2017-03-10: 10 mg via ORAL
  Filled 2017-03-09: qty 1

## 2017-03-09 MED ORDER — METOPROLOL TARTRATE 12.5 MG HALF TABLET
12.5000 mg | ORAL_TABLET | Freq: Two times a day (BID) | ORAL | Status: DC
  Administered 2017-03-09 – 2017-03-10 (×2): 12.5 mg via ORAL
  Filled 2017-03-09 (×2): qty 1

## 2017-03-09 MED ORDER — ATORVASTATIN CALCIUM 80 MG PO TABS: 80.0000 mg | ORAL_TABLET | Freq: Every day | ORAL | 0 refills | Status: AC

## 2017-03-09 MED ORDER — TECHNETIUM TC 99M TETROFOSMIN IV KIT
30.0000 | PACK | Freq: Once | INTRAVENOUS | Status: AC | PRN
  Administered 2017-03-09: 30 via INTRAVENOUS

## 2017-03-09 MED ORDER — AMLODIPINE BESYLATE 10 MG PO TABS: 10.0000 mg | ORAL_TABLET | Freq: Every day | ORAL | 0 refills | Status: DC

## 2017-03-09 MED ORDER — METOPROLOL TARTRATE 25 MG PO TABS: 12.5000 mg | ORAL_TABLET | Freq: Two times a day (BID) | ORAL | 0 refills | Status: DC

## 2017-03-09 MED ORDER — ASPIRIN 81 MG PO CHEW
81.0000 mg | CHEWABLE_TABLET | Freq: Every day | ORAL | Status: DC
  Administered 2017-03-10: 81 mg via ORAL
  Filled 2017-03-09: qty 1

## 2017-03-09 MED ORDER — ASPIRIN 81 MG PO CHEW: 81.0000 mg | CHEWABLE_TABLET | Freq: Every day | ORAL | 0 refills | Status: AC

## 2017-03-09 MED ORDER — LISINOPRIL 10 MG PO TABS: 10.0000 mg | ORAL_TABLET | Freq: Every day | ORAL | 0 refills | Status: DC

## 2017-03-09 MED ORDER — REGADENOSON 0.4 MG/5ML IV SOLN
INTRAVENOUS | Status: AC
  Filled 2017-03-09: qty 5

## 2017-03-09 MED ORDER — TECHNETIUM TC 99M TETROFOSMIN IV KIT
10.0000 | PACK | Freq: Once | INTRAVENOUS | Status: AC | PRN
  Administered 2017-03-09: 10 via INTRAVENOUS

## 2017-03-09 NOTE — Progress Notes (Signed)
Pt staying overnight d/t medication needs. Awaiting CM consult. Patient received as nontele, and have no IV access. Denies any pain. VSS. Pt. Is basically ready for D/C. All d/c papers was done and given to pt, per dayshift RN. Will monitor.

## 2017-03-09 NOTE — Progress Notes (Signed)
Iv removed. No issues at present. Reviewed discharge instructions with patient. Awaiting ride around 5 pm.  Cherylynn Ridges, Mervin Kung RN

## 2017-03-09 NOTE — Progress Notes (Deleted)
Patient Name: Geordan Xu Date of Encounter: 03/09/2017  Primary Cardiologist: [facility] Problem List     Active Problems:   CAD (coronary artery disease)   Hypokalemia   Chest pain   Tobacco abuse   Essential hypertension     Subjective   Sleeping. Had a little chest pain this AM.   Inpatient Medications    Scheduled Meds: . atorvastatin  80 mg Oral q1800  . enoxaparin (LOVENOX) injection  40 mg Subcutaneous Q24H  . lisinopril  10 mg Oral Daily  . metoprolol tartrate  25 mg Oral BID  . nicotine  21 mg Transdermal Daily  . sodium chloride flush  3 mL Intravenous Q12H   Continuous Infusions:  PRN Meds: acetaminophen, gi cocktail, morphine injection, nitroGLYCERIN, ondansetron (ZOFRAN) IV, sodium chloride flush   Vital Signs    Vitals:   03/08/17 1332 03/08/17 1951 03/08/17 2205 03/09/17 0500  BP: 134/77 (!) 144/75  (!) 161/71  Pulse: 60 (!) 50 65 65  Resp:  16  18  Temp: 98.5 F (36.9 C) 97.9 F (36.6 C)  98.1 F (36.7 C)  TempSrc: Oral Oral  Oral  SpO2: 100% 100%  99%  Weight:      Height:        Intake/Output Summary (Last 24 hours) at 03/09/17 0820 Last data filed at 03/08/17 1952  Gross per 24 hour  Intake             1560 ml  Output                0 ml  Net             1560 ml   Filed Weights   03/07/17 1645 03/07/17 2057  Weight: 220 lb (99.8 kg) 201 lb 12.8 oz (91.5 kg)    Physical Exam   GEN: Well nourished, well developed, in no acute distress.  HEENT: Grossly normal.  Neck: Supple, no JVD, carotid bruits, or masses. Cardiac: RRR, no murmurs, rubs, or gallops. No clubbing, cyanosis, edema.  Radials/DP/PT 2+ and equal bilaterally.  Respiratory:  Respirations regular and unlabored, clear to auscultation bilaterally. GI: Soft, nontender, nondistended, BS + x 4. MS: no deformity or atrophy. Skin: warm and dry, no rash. Neuro:  Strength and sensation are intact. Psych: AAOx3.  Normal affect.  Labs    CBC  Recent Labs  03/07/17 1613  WBC 6.5  HGB 11.2*  HCT 34.4*  MCV 78.4  PLT 419   Basic Metabolic Panel  Recent Labs  03/07/17 1622 03/07/17 1933 03/09/17 0401  NA 139  --  137  K 3.3*  --  3.8  CL 105  --  101  CO2 26  --  29  GLUCOSE 97  --  95  BUN 9  --  12  CREATININE 1.30*  --  1.30*  CALCIUM 8.8*  --  9.0  MG  --  2.2  --    Liver Function Tests  Recent Labs  03/07/17 1622  AST 36  ALT 28  ALKPHOS 91  BILITOT 0.5  PROT 6.5  ALBUMIN 3.4*   No results for input(s): LIPASE, AMYLASE in the last 72 hours. Cardiac Enzymes  Recent Labs  03/07/17 1933 03/08/17 0050 03/08/17 0724  TROPONINI <0.03 <0.03 <0.03   BNP Invalid input(s): POCBNP D-Dimer No results for input(s): DDIMER in the last 72 hours. Hemoglobin A1C No results for input(s): HGBA1C in the last 72 hours. Fasting Lipid Panel  Recent  Labs  03/08/17 0050  CHOL 216*  HDL 34*  LDLCALC 147*  TRIG 174*  CHOLHDL 6.4   Thyroid Function Tests  Recent Labs  03/08/17 0050  TSH 2.694    Telemetry    Sinus brady - Personally Reviewed  ECG    NSR with LVH  - Personally Reviewed  Radiology    Dg Chest Portable 1 View  Result Date: 03/07/2017 CLINICAL DATA:  Chest pain. EXAM: PORTABLE CHEST 1 VIEW COMPARISON:  No prior. FINDINGS: Thoracic aorta is tortuous. Mild cardiomegaly. Normal pulmonary vascularity. No focal infiltrate. No pleural effusion or pneumothorax. Left costophrenic angle is not completely imaged. No acute bony abnormality. IMPRESSION: Thoracic aorta is slightly tortuous. Borderline cardiomegaly. No pulmonary venous congestion. No acute pulmonary disease. Electronically Signed   By: Marcello Moores  Register   On: 03/07/2017 17:28    Cardiac Studies   2D ECHO: 03/08/2017 LV EF: 60% -   65% Study Conclusions - Left ventricle: The cavity size was normal. Systolic function was   normal. The estimated ejection fraction was in the range of 60%   to 65%. Wall motion was normal; there were no  regional wall   motion abnormalities. Left ventricular diastolic function   parameters were normal. - Atrial septum: No defect or patent foramen ovale was identified.  Patient Profile     HPI: Kory Rains is a 46 y.o. male with a history of HTN, tobacco abuse, CAD with MI/stenting in 2014 (records unavailable), and medical non compliance who presented to [facility] ED on 03/07/17 with chest pain.  Assessment & Plan    Chest pain: chest pain is atypical in that it stabbing and not related to exertion. However, it is identical to the chest pain that he had during his reported MI in 2014. We have no records on him. There is no objective evidence of ischemia at this point. He has negative cardiac enzymes and ECG with no acute ST or T-wave changes. 2D ECHO shows normal LV function with no WMAs. Plan is for nuclear stress test this morning  HTN: blood pressure remains poorly controlled. He reports compliance with his lisinopril 20mg  daily at home. He was started on Lopressor 25 mg BID upon admission. However, he is bradycardic with HR in 40s. I will decrease this to 12.5mg  BID. Lisinopril was initially held secondary to AKI with a creatinine of 1.3. However his GFR is normal and I think this is his baseline, so I resumed this at a lower dose (10 mg). I will add amlodipine 10 mg daily today.  CAD: not on a daily ASA 81mg  (he only takes an aspirin when his wife annoys him to take one). Will add this back. Not on a statin for unclear reasons. Started on atorva 80mg  daily  HLD: LDL is 147. Goal under 70 given history of CAD. I added atorvastatin 80mg  daily.   Hypokalemia: he has received supplementation  Tobacco abuse: Counseled on the importance of complete cessation.  Signed, Angelena Form, PA-C  03/09/2017, 8:20 AM

## 2017-03-09 NOTE — Discharge Summary (Signed)
Physician Discharge Summary  Peter Madden XIH:[id number] DOB: [birth year] DOA: 03/07/2017  PCP: Patient, No Pcp Per  Admit date: 03/07/2017 Discharge date: 03/09/2017  Time spent: > 35 minutes  Recommendations for Outpatient Follow-up:  1. Monitor serum creatinine   Discharge Diagnoses:  Active Problems:   CAD (coronary artery disease)   Hypokalemia   Chest pain   Tobacco abuse   Essential hypertension   Discharge Condition: stable  Diet recommendation: regular diet  Filed Weights   03/07/17 1645 03/07/17 2057  Weight: 99.8 kg (220 lb) 91.5 kg (201 lb 12.8 oz)    History of present illness:  46 y.o. male with medical history significant of CAD HTN, tobacco abuse,   Presented with chest pain while shopping at [place]. Similar to Prior heart attack. He has been having intermittent pain for a bout 1 week but getting worse and today it "doubled me over  Hospital Course:  Active Problems:   CAD (coronary artery disease)  Chest pain - Work up negative and cardiology cleared for discharge - chest x ray reports no acute pulmonary disease    Hypokalemia - Mild, will obtain BMP next a.m. order placed    Tobacco abuse - We'll continue to encourage tobacco cessation    Essential hypertension - Continue amlodipine, lisinopril, and metoprolol  Procedures:  none  Consultations:  Cardiology  Discharge Exam: Vitals:   03/09/17 1048 03/09/17 1242  BP: (!) 141/83 (!) 151/69  Pulse:  66  Resp:    Temp:  98.4 F (36.9 C)    General: Pt in nad, alert and awake Cardiovascular: rrr, no rubs Respiratory: no increased wob, no wheezes  Discharge Instructions   Discharge Instructions    Call MD for:  severe uncontrolled pain    Complete by:  As directed    Call MD for:  temperature >100.4    Complete by:  As directed    Diet - low sodium heart healthy    Complete by:  As directed    Discharge instructions    Complete by:  As directed    Please follow up with  cardiology or your primary care physician after hospital discharge.   Increase activity slowly    Complete by:  As directed      Current Discharge Medication List    START taking these medications   Details  amLODipine (NORVASC) 10 MG tablet Take 1 tablet (10 mg total) by mouth daily. Qty: 30 tablet, Refills: 0    aspirin 81 MG chewable tablet Chew 1 tablet (81 mg total) by mouth daily. Qty: 30 tablet, Refills: 0    atorvastatin (LIPITOR) 80 MG tablet Take 1 tablet (80 mg total) by mouth daily at 6 PM. Qty: 30 tablet, Refills: 0    metoprolol tartrate (LOPRESSOR) 25 MG tablet Take 0.5 tablets (12.5 mg total) by mouth 2 (two) times daily. Qty: 30 tablet, Refills: 0      CONTINUE these medications which have CHANGED   Details  lisinopril (PRINIVIL,ZESTRIL) 10 MG tablet Take 1 tablet (10 mg total) by mouth daily. Qty: 30 tablet, Refills: 0      CONTINUE these medications which have NOT CHANGED   Details  albuterol (PROVENTIL HFA;VENTOLIN HFA) 108 (90 Base) MCG/ACT inhaler Inhale 2 puffs into the lungs every 6 (six) hours as needed for wheezing or shortness of breath.    Multiple Vitamin (MULTIVITAMIN WITH MINERALS) TABS tablet Take 1 tablet by mouth daily.       Allergies  Allergen Reactions  .  Penicillins Hives    Has patient had a PCN reaction causing immediate rash, facial/tongue/throat swelling, SOB or lightheadedness with hypotension: Yes Has patient had a PCN reaction causing severe rash involving mucus membranes or skin necrosis: No Has patient had a PCN reaction that required hospitalization: No Has patient had a PCN reaction occurring within the last 10 years: No If all of the above answers are "NO", then may proceed with Cephalosporin use.   Follow-up Information    Josue Hector, MD Follow up.   Specialty:  Cardiology Why:  The office will call to arrange an appointment in the next couple months for follow up Contact information: [address] [place] [address] [phone]            The results of significant diagnostics from this hospitalization (including imaging, microbiology, ancillary and laboratory) are listed below for reference.    Significant Diagnostic Studies: Nm Myocar Multi W/spect W/wall Motion / Ef  Result Date: 03/09/2017  There was no ST segment deviation noted during stress.  No T wave inversion was noted during stress.  This is a low risk study.  The left ventricular ejection fraction is mildly decreased (45-54%).  Normalization artifact due to bowel loop on stress images. No ischemia or infarction LVE EF estimated at 47% however normal by echo (60-65%)   Dg Chest Portable 1 View  Result Date: 03/07/2017 CLINICAL DATA:  Chest pain. EXAM: PORTABLE CHEST 1 VIEW COMPARISON:  No prior. FINDINGS: Thoracic aorta is tortuous. Mild cardiomegaly. Normal pulmonary vascularity. No focal infiltrate. No pleural effusion or pneumothorax. Left costophrenic angle is not completely imaged. No acute bony abnormality. IMPRESSION: Thoracic aorta is slightly tortuous. Borderline cardiomegaly. No pulmonary venous congestion. No acute pulmonary disease. Electronically Signed   By: Marcello Moores  Register   On: 03/07/2017 17:28    Microbiology: No results found for this or any previous visit (from the past 240 hour(s)).   Labs: Basic Metabolic Panel:  Recent Labs Lab 03/07/17 1622 03/07/17 1933 03/09/17 0401  NA 139  --  137  K 3.3*  --  3.8  CL 105  --  101  CO2 26  --  29  GLUCOSE 97  --  95  BUN 9  --  12  CREATININE 1.30*  --  1.30*  CALCIUM 8.8*  --  9.0  MG  --  2.2  --    Liver Function Tests:  Recent Labs Lab 03/07/17 1622  AST 36  ALT 28  ALKPHOS 91  BILITOT 0.5  PROT 6.5  ALBUMIN 3.4*   No results for input(s): LIPASE, AMYLASE in the last 168 hours. No results for input(s): AMMONIA in the last 168 hours. CBC:  Recent Labs Lab 03/07/17 1613  WBC 6.5  HGB 11.2*  HCT  34.4*  MCV 78.4  PLT 165   Cardiac Enzymes:  Recent Labs Lab 03/07/17 1933 03/08/17 0050 03/08/17 0724  TROPONINI <0.03 <0.03 <0.03   BNP: BNP (last 3 results) No results for input(s): BNP in the last 8760 hours.  ProBNP (last 3 results) No results for input(s): PROBNP in the last 8760 hours.  CBG: No results for input(s): GLUCAP in the last 168 hours.   Signed:  Velvet Bathe MD.  [facility] 03/09/2017, 2:53 PM

## 2017-03-09 NOTE — Progress Notes (Signed)
Progress Note  Patient Name: Peter Madden Date of Encounter: 03/09/2017  Primary Cardiologist: Lovena Le  Subjective   Getting nuclear study. Wife indicates no money and ran out of BP meds needs New scripts   Inpatient Medications    Scheduled Meds: . amLODipine  10 mg Oral Daily  . aspirin  81 mg Oral Daily  . atorvastatin  80 mg Oral q1800  . enoxaparin (LOVENOX) injection  40 mg Subcutaneous Q24H  . lisinopril  10 mg Oral Daily  . metoprolol tartrate  12.5 mg Oral BID  . nicotine  21 mg Transdermal Daily  . sodium chloride flush  3 mL Intravenous Q12H   Continuous Infusions:  PRN Meds: acetaminophen, gi cocktail, morphine injection, nitroGLYCERIN, ondansetron (ZOFRAN) IV, sodium chloride flush   Vital Signs    Vitals:   03/08/17 1332 03/08/17 1951 03/08/17 2205 03/09/17 0500  BP: 134/77 (!) 144/75  (!) 161/71  Pulse: 60 (!) 50 65 65  Resp:  16  18  Temp: 98.5 F (36.9 C) 97.9 F (36.6 C)  98.1 F (36.7 C)  TempSrc: Oral Oral  Oral  SpO2: 100% 100%  99%  Weight:      Height:        Intake/Output Summary (Last 24 hours) at 03/09/17 1014 Last data filed at 03/08/17 1952  Gross per 24 hour  Intake             1560 ml  Output                0 ml  Net             1560 ml   Filed Weights   03/07/17 1645 03/07/17 2057  Weight: 99.8 kg (220 lb) 91.5 kg (201 lb 12.8 oz)    Telemetry    NSR 03/09/2017  - Personally Reviewed  ECG    NSR no acute ST changes LVH 03/08/17   - Personally Reviewed  Physical Exam   GEN: No acute distress.   Neck: No JVD Cardiac: RRR, no murmurs, rubs, or gallops.  Respiratory: Clear to auscultation bilaterally. GI: Soft, nontender, non-distended  MS: No edema; No deformity. Neuro:  Nonfocal  Psych: Normal affect   Labs    Chemistry Recent Labs Lab 03/07/17 1622 03/09/17 0401  NA 139 137  K 3.3* 3.8  CL 105 101  CO2 26 29  GLUCOSE 97 95  BUN 9 12  CREATININE 1.30* 1.30*  CALCIUM 8.8* 9.0  PROT 6.5  --     ALBUMIN 3.4*  --   AST 36  --   ALT 28  --   ALKPHOS 91  --   BILITOT 0.5  --   GFRNONAA >60 >60  GFRAA >60 >60  ANIONGAP 8 7     Hematology Recent Labs Lab 03/07/17 1613 03/08/17 0724  WBC 6.5  --   RBC 4.39 4.62  HGB 11.2*  --   HCT 34.4*  --   MCV 78.4  --   MCH 25.5*  --   MCHC 32.6  --   RDW 14.6  --   PLT 165  --     Cardiac Enzymes Recent Labs Lab 03/07/17 1933 03/08/17 0050 03/08/17 0724  TROPONINI <0.03 <0.03 <0.03    Recent Labs Lab 03/07/17 1629 03/07/17 1647 03/07/17 1903  TROPIPOC 0.00 0.01 0.01     BNPNo results for input(s): BNP, PROBNP in the last 168 hours.   DDimer No results for input(s): DDIMER in the  last 168 hours.   Radiology    Dg Chest Portable 1 View  Result Date: 03/07/2017 CLINICAL DATA:  Chest pain. EXAM: PORTABLE CHEST 1 VIEW COMPARISON:  No prior. FINDINGS: Thoracic aorta is tortuous. Mild cardiomegaly. Normal pulmonary vascularity. No focal infiltrate. No pleural effusion or pneumothorax. Left costophrenic angle is not completely imaged. No acute bony abnormality. IMPRESSION: Thoracic aorta is slightly tortuous. Borderline cardiomegaly. No pulmonary venous congestion. No acute pulmonary disease. Electronically Signed   By: Marcello Moores  Register   On: 03/07/2017 17:28    Cardiac Studies   Echo reviewed:  EF 60-65% no RWMA;s   Patient Profile     46 y.o. male  HTN , smoker CAD stent 2014 WS.  Admitted with atypical chest Pain. R/O no acute ECG changes echo normal with EF 60-65% no RWMA;s  Assessment & Plan    1) CAD: chest pian r/o for myovue today d/c home if non ischemic Echo normal EF 2) HTN:  Non compliant with meds hospitalist to write new scripts on d/c 3) Cholesterol on statin   Signed, Jenkins Rouge, MD  03/09/2017, 10:14 AM

## 2017-03-09 NOTE — Progress Notes (Signed)
Dr. Linward Foster paged and made aware of Pt and family concerns regarding medications affordability until Thursday time to be able to fill prescriptions and no CM on duty at the time concerns communicate to nursing staff. ED and floor weekend CM called and messages left with no return calls. States OK to let patient stay overnight. Charge nurse notified.

## 2017-03-10 NOTE — Progress Notes (Signed)
Pt given paper prescriptions to use with his match letter.  Pt had no further needs or questions.  Pt discharged to home.

## 2017-03-10 NOTE — Care Management Note (Signed)
Case Management Note Marvetta Gibbons RN, BSN Unit 2W-Case Manager [phone]  Patient Details  Name: Peter Madden MRN: [id number] Date of Birth: [birth year]  Subjective/Objective:   Pt presented with CAD                 Action/Plan: PTA pt lived at home with wife- spoke with pt at bed side- per pt he does not have insurance- (had Medicaid a long time ago)- pt will need MATCH for discharge- call made to [facility]- (now [facility])- cost for meds over $100 out of pocket- pt will not be able to afford this- [facility] letter will not work at the newly [employer] Aids to [employer] currently- will have to have MD print out scripts for pt to take to a participating pharmacy with [place]- have given pt [place] letter and explained program 1 time use with $3 cost per med- pt also give list of pharmacies. Pt also has been provided a list of clinics to establish with a PCP. CSW has provided pt with bus pass.   Expected Discharge Date:  03/10/17               Expected Discharge Plan:  Home/Self Care  In-House Referral:     Discharge planning Services  CM Consult, Medication Assistance, [place], [facility]  Post Acute Care Choice:    Choice offered to:     DME Arranged:    DME Agency:     HH Arranged:    HH Agency:     Status of Service:  Completed, signed off  If discussed at [employer] of Stay Meetings, dates discussed:    Discharge Disposition: home/self care   Additional Comments:  Dawayne Patricia, RN 03/10/2017, 11:45 AM

## 2017-03-10 NOTE — Clinical Social Work Note (Signed)
CSW notified pt will need transport home. CSW met with pt  @ bedside and provided pt a bus pass. Pt indicated significant other is at work and no other family/friends with reliable transportation.  No other SW DC needs identified.  Shelby B. Brown,MSW, LCSWA Clinical Social Work Dept Weekend Social Worker [phone] 10:35 AM    

## 2017-08-26 ENCOUNTER — Encounter (HOSPITAL_COMMUNITY): Payer: Self-pay

## 2017-08-26 ENCOUNTER — Emergency Department (HOSPITAL_COMMUNITY)
Admission: EM | Admit: 2017-08-26 | Discharge: 2017-08-26 | Disposition: A | Discharge status: Home / Self Care | Payer: Self-pay | Attending: Emergency Medicine | Admitting: Emergency Medicine

## 2017-08-26 DIAGNOSIS — Z79899 Other long term (current) drug therapy: Secondary | ICD-10-CM | POA: Insufficient documentation

## 2017-08-26 DIAGNOSIS — I2510 Atherosclerotic heart disease of native coronary artery without angina pectoris: Secondary | ICD-10-CM | POA: Insufficient documentation

## 2017-08-26 DIAGNOSIS — F17200 Nicotine dependence, unspecified, uncomplicated: Secondary | ICD-10-CM | POA: Insufficient documentation

## 2017-08-26 DIAGNOSIS — Z7982 Long term (current) use of aspirin: Secondary | ICD-10-CM | POA: Insufficient documentation

## 2017-08-26 DIAGNOSIS — I10 Essential (primary) hypertension: Secondary | ICD-10-CM | POA: Insufficient documentation

## 2017-08-26 MED ORDER — LISINOPRIL 10 MG PO TABS: 10.0000 mg | ORAL_TABLET | Freq: Every day | ORAL | 0 refills | Status: DC

## 2017-08-26 MED ORDER — AMLODIPINE BESYLATE 10 MG PO TABS: 10.0000 mg | ORAL_TABLET | Freq: Every day | ORAL | 0 refills | Status: AC

## 2017-08-26 MED ORDER — METOPROLOL TARTRATE 25 MG PO TABS
12.5000 mg | ORAL_TABLET | Freq: Once | ORAL | Status: AC
  Administered 2017-08-26: 12.5 mg via ORAL
  Filled 2017-08-26: qty 1

## 2017-08-26 MED ORDER — AMLODIPINE BESYLATE 5 MG PO TABS
10.0000 mg | ORAL_TABLET | Freq: Once | ORAL | Status: AC
  Administered 2017-08-26: 10 mg via ORAL
  Filled 2017-08-26: qty 2

## 2017-08-26 MED ORDER — LISINOPRIL 10 MG PO TABS
10.0000 mg | ORAL_TABLET | Freq: Once | ORAL | Status: AC
  Administered 2017-08-26: 10 mg via ORAL
  Filled 2017-08-26: qty 1

## 2017-08-26 MED ORDER — METOPROLOL TARTRATE 25 MG PO TABS: 12.5000 mg | ORAL_TABLET | Freq: Two times a day (BID) | ORAL | 0 refills | Status: AC

## 2017-08-26 NOTE — Discharge Instructions (Signed)
You were seen in the emergency department today for hypertension.  Your blood pressure was elevated today. I have given you your doses of your blood pressure medication that you typically take in the morning today.  I prescribed a 30-day prescriptions of your blood pressure medications, it is very important that you get these refilled and see a primary care provider for follow up and for refills.  If you do not have a primary care provider I have attached information for one in your discharge instructions.  Return to the emergency department if you have any new or concerning symptoms such as headache, change in vision, numbness, weakness, chest pain, or shortness of breath

## 2017-08-26 NOTE — ED Provider Notes (Signed)
[facility] EMERGENCY DEPARTMENT Provider Note   CSN: [id number] Arrival date & time: 08/26/17  3748     History   Chief Complaint Chief Complaint  Patient presents with  . Medication Refill/BP med    HPI Peter Madden is a 46 y.o. male of CAD, hypertension, and tobacco abuse presents the ED today for medication refills.  Patient relays that he is currently prescribed 3 blood pressure medications including lisinopril, metoprolol, and amlodipine, he is unable to take these due to not having access to his medications, has not taken them in a few days.  He and his wife are going through a divorce and she will not allow him into the house to get the medications.  He is requesting refills of these medications and doses while in the emergency department.  Patient is currently asymptomatic, denies headache, change in vision, chest pain, shortness of breath, numbness, or weakness.  HPI  Past Medical History:  Diagnosis Date  . Coronary artery disease   . HTN (hypertension)   . Lymphoma [facility])     Patient Active Problem List   Diagnosis Date Noted  . CAD (coronary artery disease) 03/07/2017  . Hypokalemia 03/07/2017  . Chest pain 03/07/2017  . Tobacco abuse 03/07/2017  . Essential hypertension 03/07/2017    Past Surgical History:  Procedure Laterality Date  . CARDIAC SURGERY         Home Medications    Prior to Admission medications   Medication Sig Start Date End Date Taking? Authorizing Provider  albuterol (PROVENTIL HFA;VENTOLIN HFA) 108 (90 Base) MCG/ACT inhaler Inhale 2 puffs into the lungs every 6 (six) hours as needed for wheezing or shortness of breath.    [provider]  amLODipine (NORVASC) 10 MG tablet Take 1 tablet (10 mg total) by mouth daily. 08/26/17   Keir Foland, Glynda Jaeger, PA-C  aspirin 81 MG chewable tablet Chew 1 tablet (81 mg total) by mouth daily. 03/09/17   Velvet Bathe, MD  atorvastatin (LIPITOR) 80 MG  tablet Take 1 tablet (80 mg total) by mouth daily at 6 PM. 03/09/17   Velvet Bathe, MD  lisinopril (PRINIVIL,ZESTRIL) 10 MG tablet Take 1 tablet (10 mg total) by mouth daily. 08/26/17   Jari Dipasquale R, PA-C  metoprolol tartrate (LOPRESSOR) 25 MG tablet Take 0.5 tablets (12.5 mg total) by mouth 2 (two) times daily. 08/26/17   Peniel Biel R, PA-C  Multiple Vitamin (MULTIVITAMIN WITH MINERALS) TABS tablet Take 1 tablet by mouth daily.    [provider]    Family History Family History  Problem Relation Age of Onset  . Diabetes Mother   . CAD Father   . Testicular cancer Father   . CAD Other   . Stroke Other   . Brain cancer Other     Social History Social History   Tobacco Use  . Smoking status: Smoker, Current Status Unknown  . Smokeless tobacco: Never Used  Substance Use Topics  . Alcohol use: No  . Drug use: No     Allergies   Penicillins   Review of Systems Review of Systems  Constitutional: Negative for chills and fever.  Eyes: Negative for visual disturbance.  Respiratory: Negative for chest tightness and shortness of breath.   Cardiovascular: Negative for chest pain and palpitations.  Gastrointestinal: Negative for abdominal pain.  Neurological: Negative for dizziness, weakness, light-headedness and numbness.  All other systems reviewed and are negative.    Physical Exam Updated Vital Signs BP Marland Kitchen)  174/100 (BP Location: Right Arm)   Pulse 75   Temp 98.1 F (36.7 C) (Oral)   Resp 16   SpO2 100%   Physical Exam  Constitutional: He appears well-developed and well-nourished. No distress.  HENT:  Head: Normocephalic and atraumatic.  Eyes: Conjunctivae are normal. Pupils are equal, round, and reactive to light.  Cardiovascular: Normal rate, regular rhythm and normal heart sounds. Exam reveals no gallop and no friction rub.  No murmur heard. Pulmonary/Chest: Effort normal and breath sounds normal. No respiratory distress. He has no  wheezes.  Neurological: He is alert.  Alert. Clear speech. No facial droop. CNIII-XII are grossly intact. Bilateral upper and lower extremities' sensation intact to sharp and dull touch. 5/5 grip strength bilaterally. 5/5 plantar and dorsi flexion bilaterally. Normal gait.   Psychiatric: He has a normal mood and affect. His behavior is normal. Thought content normal.  Nursing note and vitals reviewed.  ED Treatments / Results  Labs (all labs ordered are listed, but only abnormal results are displayed) Labs Reviewed - No data to display  EKG  EKG Interpretation None       Radiology No results found.  Procedures Procedures (including critical care time)  Medications Ordered in ED Medications  lisinopril (PRINIVIL,ZESTRIL) tablet 10 mg (not administered)  amLODipine (NORVASC) tablet 10 mg (not administered)  metoprolol tartrate (LOPRESSOR) tablet 12.5 mg (not administered)     Initial Impression / Assessment and Plan / ED Course  I have reviewed the triage vital signs and the nursing notes.  Pertinent labs & imaging results that were available during my care of the patient were reviewed by me and considered in my medical decision making (see chart for details).  Patient presents to obtain blood pressure medications.  He has been unable to take his medications, due to lack of access to them related to a current divorce.  Patient is nontoxic appearing, in no apparent distress.  Of note he is hypertensive in the emergency department at 174/100 suspect this is related to lack of medications over the past few days.. Patient denies headache, change in vision, numbness, weakness, chest pain, dyspnea, dizziness, or lightheadedness therefore doubt hypertensive emergency.Discussed  the need to take his medications as prescribed and need for primary care follow up for recheck of BP and prescription refills. Discussed return precaution signs/symptoms for hypertensive emergency as listed above  with the patient. He confirmed understanding.  Provided doses of patient's medications in the emergency department and prescriptions for 30 days. Discussed plan with patient, provided opportunity for questions, confirmed understanding and will follow up with PCP.   Final Clinical Impressions(s) / ED Diagnoses   Final diagnoses:  Essential hypertension    ED Discharge Orders        Ordered    amLODipine (NORVASC) 10 MG tablet  Daily     08/26/17 1154    lisinopril (PRINIVIL,ZESTRIL) 10 MG tablet  Daily     08/26/17 1154    metoprolol tartrate (LOPRESSOR) 25 MG tablet  2 times daily     08/26/17 1154       Jeremih Dearmas, [place] R, PA-C 08/26/17 1211    Fredia Sorrow, MD 08/27/17 1610

## 2018-01-30 ENCOUNTER — Other Ambulatory Visit: Payer: Self-pay

## 2018-01-30 ENCOUNTER — Emergency Department (HOSPITAL_COMMUNITY): Payer: Self-pay

## 2018-01-30 ENCOUNTER — Encounter (HOSPITAL_COMMUNITY): Payer: Self-pay | Admitting: Emergency Medicine

## 2018-01-30 ENCOUNTER — Emergency Department (HOSPITAL_COMMUNITY)
Admission: EM | Admit: 2018-01-30 | Discharge: 2018-01-31 | Disposition: A | Discharge status: Home / Self Care | Payer: Self-pay | Attending: Emergency Medicine | Admitting: Emergency Medicine

## 2018-01-30 DIAGNOSIS — Z7982 Long term (current) use of aspirin: Secondary | ICD-10-CM | POA: Insufficient documentation

## 2018-01-30 DIAGNOSIS — I10 Essential (primary) hypertension: Secondary | ICD-10-CM | POA: Insufficient documentation

## 2018-01-30 DIAGNOSIS — Y998 Other external cause status: Secondary | ICD-10-CM | POA: Insufficient documentation

## 2018-01-30 DIAGNOSIS — I2510 Atherosclerotic heart disease of native coronary artery without angina pectoris: Secondary | ICD-10-CM | POA: Insufficient documentation

## 2018-01-30 DIAGNOSIS — Y9301 Activity, walking, marching and hiking: Secondary | ICD-10-CM | POA: Insufficient documentation

## 2018-01-30 DIAGNOSIS — Z79899 Other long term (current) drug therapy: Secondary | ICD-10-CM | POA: Insufficient documentation

## 2018-01-30 DIAGNOSIS — S8391XA Sprain of unspecified site of right knee, initial encounter: Secondary | ICD-10-CM | POA: Insufficient documentation

## 2018-01-30 DIAGNOSIS — Y929 Unspecified place or not applicable: Secondary | ICD-10-CM | POA: Insufficient documentation

## 2018-01-30 DIAGNOSIS — W109XXA Fall (on) (from) unspecified stairs and steps, initial encounter: Secondary | ICD-10-CM | POA: Insufficient documentation

## 2018-01-30 DIAGNOSIS — F17200 Nicotine dependence, unspecified, uncomplicated: Secondary | ICD-10-CM | POA: Insufficient documentation

## 2018-01-30 DIAGNOSIS — R600 Localized edema: Secondary | ICD-10-CM | POA: Insufficient documentation

## 2018-01-30 NOTE — ED Triage Notes (Signed)
Pt reports falling and landing on R knee earlier today. No deformity noted. Pt states hx of knee surgery.

## 2018-01-30 NOTE — ED Notes (Signed)
Patient transported to X-ray 

## 2018-01-31 MED ORDER — TRAMADOL HCL 50 MG PO TABS: 50.0000 mg | ORAL_TABLET | Freq: Four times a day (QID) | ORAL | 0 refills | Status: DC | PRN

## 2018-01-31 NOTE — Discharge Instructions (Signed)
Get help right away if: You cannot use your injured joint to support any of your body weight (cannot bear weight). You cannot move the injured joint. You cannot walk more than a few steps without pain or without your knee buckling. You have significant pain, swelling, or numbness below the cast, brace, or splint. 

## 2018-01-31 NOTE — ED Provider Notes (Signed)
[facility] EMERGENCY DEPARTMENT Provider Note   CSN: [id number] Arrival date & time: 01/30/18  2311     History   Chief Complaint Chief Complaint  Patient presents with  . Knee Pain    HPI Peter Madden is a 47 y.o. male who presents to the emergency department with chief complaint of right knee pain.  He has a past medical history of previous ORIF of the tib-fib and knee joint.  Chronic knee pain and takes NSAIDs daily.  Patient also wears a knee brace daily.  Patient states that he moved here from [place] recently and twisted his knee walking down the stairs yesterday.  He complains of pain and swelling that is increased in the right knee.  Patient also states that "somebody is going to do something about how bad my pain is time."  He states that he is having difficulty sleeping.  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  He denies numbness, tingling, weakness, or mechanical symptoms  HPI  Past Medical History:  Diagnosis Date  . Coronary artery disease   . HTN (hypertension)   . Lymphoma [facility])     Patient Active Problem List   Diagnosis Date Noted  . CAD (coronary artery disease) 03/07/2017  . Hypokalemia 03/07/2017  . Chest pain 03/07/2017  . Tobacco abuse 03/07/2017  . Essential hypertension 03/07/2017    Past Surgical History:  Procedure Laterality Date  . CARDIAC SURGERY    . KNEE SURGERY Right         Home Medications    Prior to Admission medications   Medication Sig Start Date End Date Taking? Authorizing Provider  albuterol (PROVENTIL HFA;VENTOLIN HFA) 108 (90 Base) MCG/ACT inhaler Inhale 2 puffs into the lungs every 6 (six) hours as needed for wheezing or shortness of breath.    [provider]  amLODipine (NORVASC) 10 MG tablet Take 1 tablet (10 mg total) by mouth daily. 08/26/17   Petrucelli, Glynda Jaeger, PA-C  aspirin 81 MG chewable tablet Chew 1  tablet (81 mg total) by mouth daily. 03/09/17   Velvet Bathe, MD  atorvastatin (LIPITOR) 80 MG tablet Take 1 tablet (80 mg total) by mouth daily at 6 PM. 03/09/17   Velvet Bathe, MD  lisinopril (PRINIVIL,ZESTRIL) 10 MG tablet Take 1 tablet (10 mg total) by mouth daily. 08/26/17   Petrucelli, Samantha R, PA-C  metoprolol tartrate (LOPRESSOR) 25 MG tablet Take 0.5 tablets (12.5 mg total) by mouth 2 (two) times daily. 08/26/17   Petrucelli, Samantha R, PA-C  Multiple Vitamin (MULTIVITAMIN WITH MINERALS) TABS tablet Take 1 tablet by mouth daily.    [provider]    Family History Family History  Problem Relation Age of Onset  . Diabetes Mother   . CAD Father   . Testicular cancer Father   . CAD Other   . Stroke Other   . Brain cancer Other     Social History Social History   Tobacco Use  . Smoking status: Smoker, Current Status Unknown  . Smokeless tobacco: Never Used  Substance Use Topics  . Alcohol use: No  . Drug use: No     Allergies   Penicillins and Shellfish allergy   Review of Systems Review of Systems  Ten systems reviewed and are negative for acute change, except as noted in the HPI.   Physical Exam Updated Vital Signs BP (!) 173/91 (BP Location: Right Arm)  Pulse 90   Temp 98.6 F (37 C) (Oral)   Resp 18   Ht 6\' 1"  (1.854 m)   Wt 90.7 kg (200 lb)   SpO2 100%   BMI 26.39 kg/m   Physical Exam  Constitutional: He is oriented to person, place, and time. He appears well-developed and well-nourished. No distress.  HENT:  Head: Normocephalic and atraumatic.  Eyes: Conjunctivae are normal. No scleral icterus.  Neck: Normal range of motion. Neck supple.  Cardiovascular: Normal rate, regular rhythm and normal heart sounds.  Pulmonary/Chest: Effort normal and breath sounds normal. No respiratory distress.  Abdominal: Soft. There is no tenderness.  Musculoskeletal: He exhibits edema.  Mild effusion present on the right knee.  Full range of motion  and normal ligamentous stability.  Well-healed midline surgical scar.  Negative anterior posterior drawer  Neurological: He is alert and oriented to person, place, and time.  Skin: Skin is warm and dry. He is not diaphoretic.  Psychiatric: His behavior is normal.  Nursing note and vitals reviewed.    ED Treatments / Results  Labs (all labs ordered are listed, but only abnormal results are displayed) Labs Reviewed - No data to display  EKG None  Radiology Dg Knee Complete 4 Views Right  Result Date: 01/30/2018 CLINICAL DATA:  Fall with injury to the right knee EXAM: RIGHT KNEE - COMPLETE 4+ VIEW COMPARISON:  None. FINDINGS: No definite acute displaced fracture or malalignment. Mild degenerative changes of the medial, lateral and patellofemoral compartments with small knee effusion. Chunky calcifications along the medial and lateral femoral condyles, suspect prior ligamentous injury. IMPRESSION: 1. No definite acute osseous abnormality. 2. Mild arthritis with small knee effusion Electronically Signed   By: Donavan Foil M.D.   On: 01/30/2018 23:39    Procedures Procedures (including critical care time)  Medications Ordered in ED Medications - No data to display   Initial Impression / Assessment and Plan / ED Course  I have reviewed the triage vital signs and the nursing notes.  Pertinent labs & imaging results that were available during my care of the patient were reviewed by me and considered in my medical decision making (see chart for details).     Patient X-Ray negative for obvious fracture or dislocation.  Pt advised to follow up with orthopedics if symptoms persist.  conservative therapy recommended and discussed. Patient will be dc home & is agreeable with above plan.   Final Clinical Impressions(s) / ED Diagnoses   Final diagnoses:  None    ED Discharge Orders    None       Margarita Mail, PA-C 01/31/18 Tomasita Crumble, MD 02/03/18 1043

## 2018-01-31 NOTE — ED Notes (Signed)
ED Provider at bedside.  One touch pt, see provider's note.

## 2018-02-05 ENCOUNTER — Emergency Department (HOSPITAL_COMMUNITY): Payer: Self-pay

## 2018-02-05 ENCOUNTER — Encounter (HOSPITAL_COMMUNITY): Payer: Self-pay | Admitting: Emergency Medicine

## 2018-02-05 ENCOUNTER — Other Ambulatory Visit: Payer: Self-pay

## 2018-02-05 ENCOUNTER — Emergency Department (HOSPITAL_COMMUNITY)
Admission: EM | Admit: 2018-02-05 | Discharge: 2018-02-05 | Disposition: A | Discharge status: Home / Self Care | Payer: Self-pay | Attending: Emergency Medicine | Admitting: Emergency Medicine

## 2018-02-05 DIAGNOSIS — I10 Essential (primary) hypertension: Secondary | ICD-10-CM | POA: Insufficient documentation

## 2018-02-05 DIAGNOSIS — I2510 Atherosclerotic heart disease of native coronary artery without angina pectoris: Secondary | ICD-10-CM | POA: Insufficient documentation

## 2018-02-05 DIAGNOSIS — R079 Chest pain, unspecified: Secondary | ICD-10-CM

## 2018-02-05 DIAGNOSIS — R0789 Other chest pain: Secondary | ICD-10-CM | POA: Insufficient documentation

## 2018-02-05 DIAGNOSIS — Z79899 Other long term (current) drug therapy: Secondary | ICD-10-CM | POA: Insufficient documentation

## 2018-02-05 DIAGNOSIS — Z7982 Long term (current) use of aspirin: Secondary | ICD-10-CM | POA: Insufficient documentation

## 2018-02-05 LAB — BASIC METABOLIC PANEL
Anion gap: 10 (ref 5–15)
BUN: 10 mg/dL (ref 6–20)
CHLORIDE: 102 mmol/L (ref 101–111)
CO2: 27 mmol/L (ref 22–32)
CREATININE: 1.37 mg/dL — AB (ref 0.61–1.24)
Calcium: 9 mg/dL (ref 8.9–10.3)
GFR calc Af Amer: >60 mL/min (ref >60)
GFR calc non Af Amer: >60 mL/min (ref >60)
GLUCOSE: 131 mg/dL — AB (ref 65–99)
POTASSIUM: 3.3 mmol/L — AB (ref 3.5–5.1)
SODIUM: 139 mmol/L (ref 135–145)

## 2018-02-05 LAB — CBC
HEMATOCRIT: 38.2 % — AB (ref 39.0–52.0)
Hemoglobin: 12.4 g/dL — ABNORMAL LOW (ref 13.0–17.0)
MCH: 26.7 pg (ref 26.0–34.0)
MCHC: 32.5 g/dL (ref 30.0–36.0)
MCV: 82.2 fL (ref 78.0–100.0)
Platelets: 149 10*3/uL — ABNORMAL LOW (ref 150–400)
RBC: 4.65 MIL/uL (ref 4.22–5.81)
RDW: 14.7 % (ref 11.5–15.5)
WBC: 6.6 10*3/uL (ref 4.0–10.5)

## 2018-02-05 LAB — I-STAT TROPONIN, ED
Troponin i, poc: 0 ng/mL (ref 0.00–0.08)
Troponin i, poc: 0 ng/mL (ref 0.00–0.08)

## 2018-02-05 NOTE — ED Provider Notes (Signed)
[facility] EMERGENCY DEPARTMENT Provider Note   CSN: [id number] Arrival date & time: 02/05/18  0056     History   Chief Complaint Chief Complaint  Patient presents with  . Chest Pain    HPI Peter Madden is a 47 y.o. male.  HPI   47 year old male with history of CAD, HTN and, lymphoma presenting for evaluation of chest pain.  Patient report for the past 2 to 3 days he has had recurrent left-sided chest pain.  He described pain as a sharp stabbing sensation, nonradiating, happens sporadically even at rest and may last for 10 to 20 minutes.  He also report that for the past few episodes he also developed some shortness of breath, lightheadedness, and diaphoresis.  States the pain was similar to chest pain that he had last month when he was seen at [facility].  States that he underwent a cardiac stress test and was told that he has a partial blockage.  He mention one cardiologist was talking about having to put stents, while  another cardiologist recommend medication management.  He was started on a new medication that he cannot recall however states that he has been compliant with the medication.  However, pain has resumed.  He also report remote history of MI 4 to 5 years ago when he received cardiac stenting.  He admits to tobacco use, smoking approximately half a pack a day, with occasional alcohol use but denies recreational drug use.  Denies any recent strenuous activity or any recent injury.  Last pain episode was 2 to 3 hours ago when he was on his way to the x-ray, and it lasted for about 10 minutes.  He is currently pain-free.  Patient denies any prior history of PE or DVT, no recent surgery, prolonged bed rest, leg swelling or calf pain.  Patient also mentioned that this pain felt different from his heartburn.   Past Medical History:  Diagnosis Date  . Coronary artery disease   . HTN (hypertension)   . Lymphoma [facility])     Patient Active Problem List     Diagnosis Date Noted  . CAD (coronary artery disease) 03/07/2017  . Hypokalemia 03/07/2017  . Chest pain 03/07/2017  . Tobacco abuse 03/07/2017  . Essential hypertension 03/07/2017    Past Surgical History:  Procedure Laterality Date  . CARDIAC SURGERY    . KNEE SURGERY Right         Home Medications    Prior to Admission medications   Medication Sig Start Date End Date Taking? Authorizing Provider  albuterol (PROVENTIL HFA;VENTOLIN HFA) 108 (90 Base) MCG/ACT inhaler Inhale 2 puffs into the lungs every 6 (six) hours as needed for wheezing or shortness of breath.    [provider]  amLODipine (NORVASC) 10 MG tablet Take 1 tablet (10 mg total) by mouth daily. 08/26/17   Petrucelli, Glynda Jaeger, PA-C  aspirin 81 MG chewable tablet Chew 1 tablet (81 mg total) by mouth daily. 03/09/17   Velvet Bathe, MD  atorvastatin (LIPITOR) 80 MG tablet Take 1 tablet (80 mg total) by mouth daily at 6 PM. 03/09/17   Velvet Bathe, MD  lisinopril (PRINIVIL,ZESTRIL) 10 MG tablet Take 1 tablet (10 mg total) by mouth daily. 08/26/17   Petrucelli, Samantha R, PA-C  metoprolol tartrate (LOPRESSOR) 25 MG tablet Take 0.5 tablets (12.5 mg total) by mouth 2 (two) times daily. 08/26/17   Petrucelli, Samantha R, PA-C  Multiple Vitamin (MULTIVITAMIN WITH MINERALS) TABS tablet Take 1  tablet by mouth daily.    [provider]  traMADol (ULTRAM) 50 MG tablet Take 1 tablet (50 mg total) by mouth every 6 (six) hours as needed. 01/31/18   Margarita Mail, PA-C    Family History Family History  Problem Relation Age of Onset  . Diabetes Mother   . CAD Father   . Testicular cancer Father   . CAD Other   . Stroke Other   . Brain cancer Other     Social History Social History   Tobacco Use  . Smoking status: Smoker, Current Status Unknown  . Smokeless tobacco: Never Used  Substance Use Topics  . Alcohol use: No  . Drug use: No     Allergies   Penicillins and Shellfish allergy   Review  of Systems Review of Systems  All other systems reviewed and are negative.    Physical Exam Updated Vital Signs BP (!) 186/116   Pulse 63   Temp 98.6 F (37 C) (Oral)   Resp 10   Ht 6' (1.829 m)   Wt 90.7 kg (200 lb)   SpO2 98%   BMI 27.12 kg/m   Physical Exam  Constitutional: He appears well-developed and well-nourished. No distress.  HENT:  Head: Atraumatic.  Eyes: Conjunctivae are normal.  Neck: Neck supple.  Cardiovascular: Normal rate, regular rhythm, intact distal pulses and normal pulses.  Pulmonary/Chest: Effort normal and breath sounds normal. He has no decreased breath sounds. He has no wheezes.  Abdominal: Soft. There is no tenderness.  Musculoskeletal:       Right lower leg: He exhibits no edema.       Left lower leg: He exhibits no edema.  Neurological: He is alert.  Skin: Skin is warm. Capillary refill takes less than 2 seconds. No rash noted.  Psychiatric: He has a normal mood and affect.  Nursing note and vitals reviewed.    ED Treatments / Results  Labs (all labs ordered are listed, but only abnormal results are displayed) Labs Reviewed  BASIC METABOLIC PANEL - Abnormal; Notable for the following components:      Result Value   Potassium 3.3 (*)    Glucose, Bld 131 (*)    Creatinine, Ser 1.37 (*)    All other components within normal limits  CBC - Abnormal; Notable for the following components:   Hemoglobin 12.4 (*)    HCT 38.2 (*)    Platelets 149 (*)    All other components within normal limits  I-STAT TROPONIN, ED  I-STAT TROPONIN, ED    EKG EKG Interpretation  Date/Time:  Thursday Feb 05 2018 01:11:51 EDT Ventricular Rate:  91 PR Interval:  180 QRS Duration: 98 QT Interval:  350 QTC Calculation: 430 R Axis:   78 Text Interpretation:  Normal sinus rhythm Possible Left atrial enlargement Left ventricular hypertrophy Abnormal ECG When compared with ECG of 03/08/2017, No significant change was found Confirmed by Delora Fuel ([id number]) on  02/05/2018 2:24:20 AM   Radiology Dg Chest 2 View  Result Date: 02/05/2018 CLINICAL DATA:  47 year old male with chest pain. EXAM: CHEST - 2 VIEW COMPARISON:  Chest radiograph dated 03/07/2017 FINDINGS: The heart size and mediastinal contours are within normal limits. Both lungs are clear. The visualized skeletal structures are unremarkable. IMPRESSION: No active cardiopulmonary disease. Electronically Signed   By: Anner Crete M.D.   On: 02/05/2018 02:37    Procedures Procedures (including critical care time)  Medications Ordered in ED Medications - No data to display  Initial Impression / Assessment and Plan / ED Course  I have reviewed the triage vital signs and the nursing notes.  Pertinent labs & imaging results that were available during my care of the patient were reviewed by me and considered in my medical decision making (see chart for details).     BP (!) 185/117   Pulse (!) 49   Temp 98.6 F (37 C) (Oral)   Resp 14   Ht 6' (1.829 m)   Wt 90.7 kg (200 lb)   SpO2 99%   BMI 27.12 kg/m    Final Clinical Impressions(s) / ED Diagnoses   Final diagnoses:  Nonspecific chest pain    ED Discharge Orders    None      Most recent cardiac stress test on 03/09/2017 showing low risk study.  Myocardial perfusion was normal.  EF 45-54%.  However pt report having a recent stress test last month at [facility] which shows "partial blockage".  I'm unable to locate the report in our system for that stress test.  Pt currently pain free.  Will consult cardiology for further recommendation.    EKG without acute changes, normal delta troponin, pt is currently pain free.     7:47 AM Appreciate consultation from on call cardiologist Dr. Sallyanne Kuster who recommend outpt f/u with cardiology in the next 2 weeks for further evaluation.  He will help schedule the appointment.  Pt will be notified for appointment time via scheduler.      Domenic Moras, PA-C 02/05/18 [id number]    Orpah Greek, MD 02/05/18 [phone]

## 2018-02-05 NOTE — ED Notes (Signed)
EDP aware of bp.  Patient no in room to deliver discharge paper work

## 2018-02-05 NOTE — ED Triage Notes (Addendum)
Pt states intermittent chest pain, burning, left-sided non radiating for the past few days. Tonight consistent with nausea, shob.  Pt states he has had a stress test at [facility] recently.  Pt had stent placement approximately 4 years ago.

## 2018-02-05 NOTE — Discharge Instructions (Addendum)
Cardiology office will contact you for a close follow up appointment in the next 2 weeks.  However, if you do not hear from them after 3-4 days, please call the office to set up an appointment.  Return if you have any concerns.

## 2018-02-12 ENCOUNTER — Encounter (HOSPITAL_COMMUNITY): Payer: Self-pay | Admitting: *Deleted

## 2018-02-12 ENCOUNTER — Other Ambulatory Visit: Payer: Self-pay

## 2018-02-12 ENCOUNTER — Emergency Department (HOSPITAL_COMMUNITY)
Admission: EM | Admit: 2018-02-12 | Discharge: 2018-02-12 | Disposition: A | Discharge status: Home / Self Care | Payer: Self-pay | Attending: Emergency Medicine | Admitting: Emergency Medicine

## 2018-02-12 ENCOUNTER — Encounter (HOSPITAL_COMMUNITY): Payer: Self-pay

## 2018-02-12 ENCOUNTER — Emergency Department (HOSPITAL_COMMUNITY)
Admission: EM | Admit: 2018-02-12 | Discharge: 2018-02-13 | Disposition: A | Discharge status: Home / Self Care | Payer: Self-pay | Attending: Emergency Medicine | Admitting: Emergency Medicine

## 2018-02-12 DIAGNOSIS — Z760 Encounter for issue of repeat prescription: Secondary | ICD-10-CM | POA: Insufficient documentation

## 2018-02-12 DIAGNOSIS — R4689 Other symptoms and signs involving appearance and behavior: Secondary | ICD-10-CM

## 2018-02-12 DIAGNOSIS — R440 Auditory hallucinations: Secondary | ICD-10-CM

## 2018-02-12 DIAGNOSIS — F1410 Cocaine abuse, uncomplicated: Secondary | ICD-10-CM

## 2018-02-12 DIAGNOSIS — I252 Old myocardial infarction: Secondary | ICD-10-CM | POA: Insufficient documentation

## 2018-02-12 DIAGNOSIS — Z7982 Long term (current) use of aspirin: Secondary | ICD-10-CM | POA: Insufficient documentation

## 2018-02-12 DIAGNOSIS — Z79899 Other long term (current) drug therapy: Secondary | ICD-10-CM | POA: Insufficient documentation

## 2018-02-12 DIAGNOSIS — I2510 Atherosclerotic heart disease of native coronary artery without angina pectoris: Secondary | ICD-10-CM | POA: Insufficient documentation

## 2018-02-12 DIAGNOSIS — F17210 Nicotine dependence, cigarettes, uncomplicated: Secondary | ICD-10-CM | POA: Insufficient documentation

## 2018-02-12 DIAGNOSIS — F14159 Cocaine abuse with cocaine-induced psychotic disorder, unspecified: Secondary | ICD-10-CM | POA: Diagnosis present

## 2018-02-12 DIAGNOSIS — R45850 Homicidal ideations: Secondary | ICD-10-CM | POA: Insufficient documentation

## 2018-02-12 DIAGNOSIS — R079 Chest pain, unspecified: Secondary | ICD-10-CM

## 2018-02-12 DIAGNOSIS — R0789 Other chest pain: Secondary | ICD-10-CM | POA: Insufficient documentation

## 2018-02-12 DIAGNOSIS — Z765 Malingerer [conscious simulation]: Secondary | ICD-10-CM | POA: Insufficient documentation

## 2018-02-12 DIAGNOSIS — I10 Essential (primary) hypertension: Secondary | ICD-10-CM

## 2018-02-12 DIAGNOSIS — F14151 Cocaine abuse with cocaine-induced psychotic disorder with hallucinations: Secondary | ICD-10-CM

## 2018-02-12 MED ORDER — METOPROLOL TARTRATE 25 MG PO TABS
12.5000 mg | ORAL_TABLET | Freq: Once | ORAL | Status: DC
  Filled 2018-02-12: qty 1

## 2018-02-12 MED ORDER — NAPROXEN 250 MG PO TABS
500.0000 mg | ORAL_TABLET | Freq: Once | ORAL | Status: DC
  Filled 2018-02-12: qty 2
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
## 2018-02-12 NOTE — ED Notes (Signed)
Bed: [facility] Expected date:  Expected time:  Means of arrival:  Comments: 

## 2018-02-12 NOTE — ED Provider Notes (Signed)
[facility] EMERGENCY DEPARTMENT Provider Note   CSN: [id number] Arrival date & time: 02/12/18  0102     History   Chief Complaint Chief Complaint  Patient presents with  . Medication Refill    HPI Peter Madden is a 47 y.o. male.  The history is provided by the patient. No language interpreter was used.  Medication Refill  Medications/supplies requested:  Blood pressure medication and narcotic pain medication Reason for request:  Prescriptions lost Patient has complete original prescription information: no   Source of information:  Patient reported - no other verification available   Past Medical History:  Diagnosis Date  . Coronary artery disease   . HTN (hypertension)   . Lymphoma [facility])     Patient Active Problem List   Diagnosis Date Noted  . CAD (coronary artery disease) 03/07/2017  . Hypokalemia 03/07/2017  . Chest pain 03/07/2017  . Tobacco abuse 03/07/2017  . Essential hypertension 03/07/2017    Past Surgical History:  Procedure Laterality Date  . CARDIAC SURGERY    . KNEE SURGERY Right         Home Medications    Prior to Admission medications   Medication Sig Start Date End Date Taking? Authorizing Provider  albuterol (PROVENTIL HFA;VENTOLIN HFA) 108 (90 Base) MCG/ACT inhaler Inhale 2 puffs into the lungs every 6 (six) hours as needed for wheezing or shortness of breath.    [provider]  amLODipine (NORVASC) 10 MG tablet Take 1 tablet (10 mg total) by mouth daily. 08/26/17   Petrucelli, Glynda Jaeger, PA-C  aspirin 81 MG chewable tablet Chew 1 tablet (81 mg total) by mouth daily. 03/09/17   Velvet Bathe, MD  atorvastatin (LIPITOR) 80 MG tablet Take 1 tablet (80 mg total) by mouth daily at 6 PM. 03/09/17   Velvet Bathe, MD  lisinopril (PRINIVIL,ZESTRIL) 10 MG tablet Take 1 tablet (10 mg total) by mouth daily. 08/26/17   Petrucelli, Samantha R, PA-C  metoprolol tartrate (LOPRESSOR) 25 MG tablet Take 0.5  tablets (12.5 mg total) by mouth 2 (two) times daily. 08/26/17   Petrucelli, Samantha R, PA-C  Multiple Vitamin (MULTIVITAMIN WITH MINERALS) TABS tablet Take 1 tablet by mouth daily.    [provider]  traMADol (ULTRAM) 50 MG tablet Take 1 tablet (50 mg total) by mouth every 6 (six) hours as needed. 01/31/18   Margarita Mail, PA-C    Family History Family History  Problem Relation Age of Onset  . Diabetes Mother   . CAD Father   . Testicular cancer Father   . CAD Other   . Stroke Other   . Brain cancer Other     Social History Social History   Tobacco Use  . Smoking status: Smoker, Current Status Unknown  . Smokeless tobacco: Never Used  Substance Use Topics  . Alcohol use: No  . Drug use: No     Allergies   Penicillins and Shellfish allergy   Review of Systems Review of Systems  Respiratory: Negative for shortness of breath.   Cardiovascular: Negative for chest pain.  All other systems reviewed and are negative.    Physical Exam Updated Vital Signs BP (!) 178/98 (BP Location: Right Arm)   Pulse 84   Temp 98.3 F (36.8 C) (Oral)   Resp 14   Ht 6' (1.829 m)   Wt 90.7 kg (200 lb)   SpO2 100%   BMI 27.12 kg/m   Physical Exam  Constitutional: He appears well-developed and well-nourished.  HENT:  Head: Normocephalic and atraumatic.  Eyes: EOM are normal.  Neck: Normal range of motion. Neck supple.  Cardiovascular: Normal rate, regular rhythm, normal heart sounds and intact distal pulses.  Pulmonary/Chest: Effort normal and breath sounds normal. No stridor. He has no wheezes. He has no rales.  Abdominal: Soft. Bowel sounds are normal. There is no tenderness.  Musculoskeletal: Normal range of motion.  Neurological: He is alert.  Skin: Skin is warm and dry. Capillary refill takes less than 2 seconds.  Psychiatric: His affect is angry.     ED Treatments / Results  Labs (all labs ordered are listed, but only abnormal results are displayed) Labs  Reviewed - No data to display  EKG None  Radiology No results found.  Procedures Procedures (including critical care time)  Medications Ordered in ED Medications  metoprolol tartrate (LOPRESSOR) tablet 12.5 mg (has no administration in time range)  naproxen (NAPROSYN) tablet 500 mg (has no administration in time range)      Final Clinical Impressions(s) / ED Diagnoses   Final diagnoses:  Medication refill    EDP politely explained we don't fill lost nacotic RX. Patient does not know when he last had his medication.  EDP stated we would fill his BP medications and provide RX for non narcotic pain medication.     Patient became belligerent with nurse and used F word multiple times and left as the narcotic is what he wanted    Page, Marshaun Lortie, MD 02/12/18 [phone]

## 2018-02-12 NOTE — ED Triage Notes (Signed)
The pt is here to get his medication refilled   He wants bp med and pain med for his knees
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
## 2018-02-12 NOTE — ED Notes (Signed)
Pt observed leaving ED

## 2018-02-13 DIAGNOSIS — F14159 Cocaine abuse with cocaine-induced psychotic disorder, unspecified: Secondary | ICD-10-CM | POA: Diagnosis present

## 2018-02-13 LAB — COMPREHENSIVE METABOLIC PANEL
ALK PHOS: 70 U/L (ref 38–126)
ALT: 27 U/L (ref 17–63)
AST: 31 U/L (ref 15–41)
Albumin: 4.1 g/dL (ref 3.5–5.0)
Anion gap: 12 (ref 5–15)
BUN: 9 mg/dL (ref 6–20)
CALCIUM: 9.3 mg/dL (ref 8.9–10.3)
CO2: 25 mmol/L (ref 22–32)
CREATININE: 1.07 mg/dL (ref 0.61–1.24)
Chloride: 105 mmol/L (ref 101–111)
Glucose, Bld: 97 mg/dL (ref 65–99)
Potassium: 3.8 mmol/L (ref 3.5–5.1)
Sodium: 142 mmol/L (ref 135–145)
Total Bilirubin: 0.7 mg/dL (ref 0.3–1.2)
Total Protein: 7.6 g/dL (ref 6.5–8.1)

## 2018-02-13 LAB — CBC
HCT: 40.7 % (ref 39.0–52.0)
Hemoglobin: 13.7 g/dL (ref 13.0–17.0)
MCH: 27.2 pg (ref 26.0–34.0)
MCHC: 33.7 g/dL (ref 30.0–36.0)
MCV: 80.9 fL (ref 78.0–100.0)
Platelets: 157 10*3/uL (ref 150–400)
RBC: 5.03 MIL/uL (ref 4.22–5.81)
RDW: 14.9 % (ref 11.5–15.5)
WBC: 7 10*3/uL (ref 4.0–10.5)

## 2018-02-13 LAB — RAPID URINE DRUG SCREEN, HOSP PERFORMED
AMPHETAMINES: NOT DETECTED
Barbiturates: NOT DETECTED
Benzodiazepines: NOT DETECTED
Cocaine: POSITIVE — AB
OPIATES: NOT DETECTED
TETRAHYDROCANNABINOL: NOT DETECTED

## 2018-02-13 LAB — I-STAT TROPONIN, ED
TROPONIN I, POC: 0 ng/mL (ref 0.00–0.08)
TROPONIN I, POC: 0.01 ng/mL (ref 0.00–0.08)

## 2018-02-13 LAB — ETHANOL

## 2018-02-13 LAB — SALICYLATE LEVEL

## 2018-02-13 LAB — ACETAMINOPHEN LEVEL: Acetaminophen (Tylenol), Serum: <10 ug/mL — ABNORMAL LOW (ref 10–30)

## 2018-02-13 MED ORDER — LORAZEPAM 2 MG/ML IJ SOLN: 0.0000 mg | Freq: Two times a day (BID) | INTRAMUSCULAR | Status: DC

## 2018-02-13 MED ORDER — ONDANSETRON HCL 4 MG PO TABS: 4.0000 mg | ORAL_TABLET | Freq: Three times a day (TID) | ORAL | Status: DC | PRN

## 2018-02-13 MED ORDER — THIAMINE HCL 100 MG/ML IJ SOLN: 100.0000 mg | Freq: Every day | INTRAMUSCULAR | Status: DC

## 2018-02-13 MED ORDER — ACETAMINOPHEN 500 MG PO TABS
1000.0000 mg | ORAL_TABLET | Freq: Once | ORAL | Status: AC
  Administered 2018-02-13: 1000 mg via ORAL
  Filled 2018-02-13: qty 2

## 2018-02-13 MED ORDER — LORAZEPAM 1 MG PO TABS: 0.0000 mg | ORAL_TABLET | Freq: Two times a day (BID) | ORAL | Status: DC

## 2018-02-13 MED ORDER — LORAZEPAM 2 MG/ML IJ SOLN: 0.0000 mg | Freq: Four times a day (QID) | INTRAMUSCULAR | Status: DC

## 2018-02-13 MED ORDER — NICOTINE 21 MG/24HR TD PT24: 21.0000 mg | MEDICATED_PATCH | Freq: Every day | TRANSDERMAL | Status: DC

## 2018-02-13 MED ORDER — ASPIRIN 81 MG PO CHEW
324.0000 mg | CHEWABLE_TABLET | Freq: Once | ORAL | Status: AC
  Administered 2018-02-13: 324 mg via ORAL
  Filled 2018-02-13: qty 4

## 2018-02-13 MED ORDER — ACETAMINOPHEN 325 MG PO TABS: 650.0000 mg | ORAL_TABLET | ORAL | Status: DC | PRN

## 2018-02-13 MED ORDER — AMLODIPINE BESYLATE 5 MG PO TABS
10.0000 mg | ORAL_TABLET | Freq: Every day | ORAL | Status: DC
  Administered 2018-02-13: 10 mg via ORAL
  Filled 2018-02-13: qty 2

## 2018-02-13 MED ORDER — AMLODIPINE BESYLATE 5 MG PO TABS
10.0000 mg | ORAL_TABLET | Freq: Once | ORAL | Status: AC
  Administered 2018-02-13: 10 mg via ORAL
  Filled 2018-02-13: qty 2

## 2018-02-13 MED ORDER — ALUM & MAG HYDROXIDE-SIMETH 200-200-20 MG/5ML PO SUSP: 30.0000 mL | Freq: Four times a day (QID) | ORAL | Status: DC | PRN

## 2018-02-13 MED ORDER — LORAZEPAM 1 MG PO TABS: 0.0000 mg | ORAL_TABLET | Freq: Four times a day (QID) | ORAL | Status: DC

## 2018-02-13 MED ORDER — ATORVASTATIN CALCIUM 80 MG PO TABS: 80.0000 mg | ORAL_TABLET | Freq: Every day | ORAL | Status: DC

## 2018-02-13 MED ORDER — TRAMADOL HCL 50 MG PO TABS: 50.0000 mg | ORAL_TABLET | Freq: Four times a day (QID) | ORAL | Status: DC | PRN

## 2018-02-13 MED ORDER — LISINOPRIL 10 MG PO TABS
10.0000 mg | ORAL_TABLET | Freq: Once | ORAL | Status: AC
  Administered 2018-02-13: 10 mg via ORAL
  Filled 2018-02-13: qty 1

## 2018-02-13 MED ORDER — METOPROLOL TARTRATE 25 MG PO TABS
25.0000 mg | ORAL_TABLET | Freq: Once | ORAL | Status: AC
  Administered 2018-02-13: 25 mg via ORAL
  Filled 2018-02-13: qty 1

## 2018-02-13 MED ORDER — ASPIRIN 81 MG PO CHEW
81.0000 mg | CHEWABLE_TABLET | Freq: Every day | ORAL | Status: DC
  Administered 2018-02-13: 81 mg via ORAL
  Filled 2018-02-13: qty 1

## 2018-02-13 MED ORDER — METOPROLOL TARTRATE 25 MG PO TABS
12.5000 mg | ORAL_TABLET | Freq: Two times a day (BID) | ORAL | Status: DC
  Administered 2018-02-13: 12.5 mg via ORAL
  Filled 2018-02-13: qty 1

## 2018-02-13 MED ORDER — VITAMIN B-1 100 MG PO TABS
100.0000 mg | ORAL_TABLET | Freq: Every day | ORAL | Status: DC
  Administered 2018-02-13: 100 mg via ORAL
  Filled 2018-02-13: qty 1
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
## 2018-02-13 NOTE — ED Notes (Signed)
Patient have 2 bags in ED belongings hall D
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
## 2018-02-13 NOTE — ED Notes (Addendum)
Patient calm and cooperative with care at this time and he is aware that his labs are to be drawn at this time. Pt agrees to blood draw.

## 2018-02-13 NOTE — ED Provider Notes (Signed)
ED ECG REPORT   Date: 02/13/2018 0026  Rate: 84  Rhythm: normal sinus rhythm  QRS Axis: normal  Intervals: normal  ST/T Wave abnormalities: LVH  Conduction Disutrbances:none  Narrative Interpretation:   Old EKG Reviewed: unchanged  I have personally reviewed the EKG tracing and agree with the computerized printout as noted.    Ripley Fraise, MD 02/13/18 [phone]

## 2018-02-13 NOTE — ED Notes (Signed)
Patient arrived to unit and is noted to be pleasant and bright on approach. Patient currently denies homicidal or suicidal ideations and verbally contracts for safety. Pt provided sandwich and soda upon request. No distress noted at this time.

## 2018-02-13 NOTE — Progress Notes (Signed)
TTS consulted with Lindon Romp, NP who recommends continued observation for safety and stabilization and to be reassessed by Psych in order to determine final disposition. EDP Muthersbaugh, Gwenlyn Perking and pt's nurse Sharyn Lull, RN have been advised.  Lind Covert, MSW, LCSW Therapeutic Triage Specialist  [phone]

## 2018-02-13 NOTE — BH Assessment (Signed)
[facility] Assessment Progress Note  Per Buford Dresser, DO, this pt does not require psychiatric hospitalization at this time.  Pt is to be discharged from [facility] with recommendation to follow up with [facility].  This has been included in pt's discharge instructions.  Pt would also benefit from seeing Peer Support Specialists; they will be asked to speak to pt.  Pt's nurse, Caren Griffins, has been notified.  Jalene Mullet, [place] Triage Specialist [phone]

## 2018-02-13 NOTE — Discharge Instructions (Signed)
To help you maintain a sober lifestyle, a substance abuse treatment program may be beneficial to you.  Contact [facility] at your earliest opportunity to ask about enrolling in their program: ° °     [facility] ([facility] °     [address] °     [place], [place] [address] °     [phone] °     New patients are seen at the walk-in clinic every Tuesday from 9:00 am - 12:00 pm °

## 2018-02-13 NOTE — Patient Outreach (Signed)
[facility] met with the patient and provided substance use recovery support. Patient states that he is interested in recovery housing with an [facility] and getting connected with outpatient substance use treatment services. [facility]PSS provided the patient with an [place] [facility] list and highlighted each vacancy for a male [place] in the [place] area. CPSS talked to the patient about [facility] for their outpatient services and explained what services they provide. CPSS also provided residential/outpatient substance use treatment center list, NA meeting list, and [facility] contact information. CPSS strongly encouraged the patient to contact [facility] at anytime for substance use recovery support or help with recovery resources.

## 2018-02-13 NOTE — [facility] Suicide Risk Assessment (Signed)
Suicide Risk Assessment  Discharge Assessment   [facility] Discharge Suicide Risk Assessment   Principal Problem: Cocaine abuse with cocaine-induced psychotic disorder [facility]) Discharge Diagnoses:  Patient Active Problem List   Diagnosis Date Noted  . Cocaine abuse with cocaine-induced psychotic disorder [facility]) [F14.159] 02/13/2018    Priority: High  . CAD (coronary artery disease) [I25.10] 03/07/2017  . Hypokalemia [E87.6] 03/07/2017  . Chest pain [R07.9] 03/07/2017  . Tobacco abuse [Z72.0] 03/07/2017  . Essential hypertension [I10] 03/07/2017    Total Time spent with patient: 45 minutes  Musculoskeletal: Strength & Muscle Tone: within normal limits Gait & Station: normal Patient leans: N/A  Psychiatric Specialty Exam:   Blood pressure 126/77, pulse 68, temperature 98.2 F (36.8 C), temperature source Oral, resp. rate 12, SpO2 98 %.There is no height or weight on file to calculate BMI.  General Appearance: Casual  Eye Contact::  Good  Speech:  Normal Rate409  Volume:  Normal  Mood:  Euthymic  Affect:  Congruent  Thought Process:  Coherent and Descriptions of Associations: Intact  Orientation:  Full (Time, Place, and Person)  Thought Content:  WDL and Logical  Suicidal Thoughts:  No  Homicidal Thoughts:  No  Memory:  Immediate;   Good Recent;   Good Remote;   Good  Judgement:  Fair  Insight:  Fair  Psychomotor Activity:  Normal  Concentration:  Fair  Recall:  Good  Fund of Knowledge:Fair  Language: Good  Akathisia:  No  Handed:  Right  AIMS (if indicated):     Assets:  Leisure Time Physical Health Resilience Social Support  Sleep:     Cognition: WNL  ADL's:  Intact   Mental Status Per Nursing Assessment::   On Admission:   47 yo male who presented to the ED after using cocaine and hearing voices telling him to hurt people.  On assessment, he denies suicidal/homicidal ideations, hallucinations, or withdrawal symptoms.  No rehab or recovery desire but Peer Support  consult placed, stable for discharge.  Demographic Factors:  Male  Loss Factors: NA  Historical Factors: NA  Risk Reduction Factors:   Sense of responsibility to family, Living with another person, especially a relative and Positive social support  Continued Clinical Symptoms:  None   Cognitive Features That Contribute To Risk:  None    Suicide Risk:  Minimal: No identifiable suicidal ideation.  Patients presenting with no risk factors but with morbid ruminations; may be classified as minimal risk based on the severity of the depressive symptoms    Plan Of Care/Follow-up recommendations:  Activity:  as tolerated Diet:  heart healthy diet  Hosanna Betley, NP 02/13/2018, 11:06 AM

## 2018-02-20 ENCOUNTER — Other Ambulatory Visit: Payer: Self-pay

## 2018-02-20 ENCOUNTER — Emergency Department (HOSPITAL_COMMUNITY): Admission: EM | Admit: 2018-02-20 | Discharge: 2018-02-20 | Discharge status: Against Medical Advice | Payer: Self-pay

## 2018-02-20 NOTE — ED Triage Notes (Signed)
Patient walked back into ED lobby, grabbed his stuff, threw his ID band and stickers on the floor and walked back out.

## 2018-02-20 NOTE — ED Triage Notes (Signed)
Patient verbally aggressive toward staff, security, and [employer] officer while in ED lobby.   Patient left ED lobby and walked to the bus stop.

## 2018-02-26 NOTE — Progress Notes (Deleted)
Cardiology Office Note:    Date:  02/27/2018   ID:  Peter Madden, DOB [birth year], MRN [id number]  PCP:  Patient, No Pcp Per  Cardiologist:  Jenkins Rouge, MD   Referring MD: No ref. provider found   No chief complaint on file. ***  History of Present Illness:    Peter Madden is a 47 y.o. male with a hx of HTN, tobacco abuse, CAD with MI and stenting in 2014 (no records, but mentioned in a note in Care Everywhere), and medical noncompliance. He was seen by our service 03/08/17 in the hospital for chest pain rule out. He underwent a myoview that was negative for reversible ischemia. Cardiac enzymes were negative and EKG was nonacute. He has bee noncompliant with his HTN medications and ASA (only takes them when his wife annoys him about it).  He has been seen in the ED frequently over th past month. On 01/30/18, he was seen for a right knee sprain. On 02/05/18, he was seen for chest pain. Cardiac enzymes were negative and EKG was nonacute. He reported having an MI 4-5 years ago and received cardiac stenting. I do not have records of this. Pt stated that he had a stress test 12/2017 at [facility] that showed a "partial blockage." Cardiology was curbsided who recommended cardiology follow up.  On 02/12/18, he reported to the ED for medication refills for his blood pressure medications and narcotics. When he ws told that ED wold not refill a lost narcotic prescription, he became belligerent and cursed at the nurse and left. He returned later that day with suicidal and homicidal ideation and was seen by behavioral health. He had used large amounts of cocaine. On 02/20/18, pt came to the ED, but angrily left after aggressive behavior toward staff, security, and [employer] officer in the ED lobby. He was not seen by a provider.       Past Medical History:  Diagnosis Date  . Coronary artery disease   . HTN (hypertension)   . Lymphoma [facility])     Past Surgical History:  Procedure  Laterality Date  . CARDIAC SURGERY    . KNEE SURGERY Right     Current Medications: No outpatient medications have been marked as taking for the 02/27/18 encounter (Appointment) with Isaiah Serge, NP.     Allergies:   Penicillins and Shellfish allergy   Social History   Socioeconomic History  . Marital status: Married    Spouse name: Not on file  . Number of children: Not on file  . Years of education: Not on file  . Highest education level: Not on file  Occupational History  . Not on file  Social Needs  . Financial resource strain: Not on file  . Food insecurity:    Worry: Not on file    Inability: Not on file  . Transportation needs:    Medical: Not on file    Non-medical: Not on file  Tobacco Use  . Smoking status: Smoker, Current Status Unknown  . Smokeless tobacco: Never Used  Substance and Sexual Activity  . Alcohol use: No  . Drug use: Yes    Types: Cocaine  . Sexual activity: Yes  Lifestyle  . Physical activity:    Days per week: Not on file    Minutes per session: Not on file  . Stress: Not on file  Relationships  . Social connections:    Talks on phone: Not on file    Gets together:  Not on file    Attends religious service: Not on file    Active member of club or organization: Not on file    Attends meetings of clubs or organizations: Not on file    Relationship status: Not on file  Other Topics Concern  . Not on file  Social History Narrative  . Not on file     Family History: The patient's ***family history includes Brain cancer in his other; CAD in his father and other; Diabetes in his mother; Stroke in his other; Testicular cancer in his father.  ROS:   Please see the history of present illness.    *** All other systems reviewed and are negative.  EKGs/Labs/Other Studies Reviewed:    The following studies were reviewed today:  Myoview 03/09/17:  There was no ST segment deviation noted during stress.  No T wave inversion was noted  during stress.  This is a low risk study.  The left ventricular ejection fraction is mildly decreased (45-54%).   Normalization artifact due to bowel loop on stress images. No ischemia or infarction LVE EF estimated at 47% however normal by echo (60-65%)   Echo 03/08/17: Study Conclusions - Left ventricle: The cavity size was normal. Systolic function was   normal. The estimated ejection fraction was in the range of 60%   to 65%. Wall motion was normal; there were no regional wall   motion abnormalities. Left ventricular diastolic function   parameters were normal. - Atrial septum: No defect or patent foramen ovale was identified.   EKG:  EKG is *** ordered today.  The ekg ordered today demonstrates ***  Recent Labs: 03/07/2017: Magnesium 2.2 03/08/2017: TSH 2.694 02/13/2018: ALT 27; BUN 9; Creatinine, Ser 1.07; Hemoglobin 13.7; Platelets 157; Potassium 3.8; Sodium 142  Recent Lipid Panel    Component Value Date/Time   CHOL 216 (H) 03/08/2017 0050   TRIG 174 (H) 03/08/2017 0050   HDL 34 (L) 03/08/2017 0050   CHOLHDL 6.4 03/08/2017 0050   VLDL 35 03/08/2017 0050   LDLCALC 147 (H) 03/08/2017 0050    Physical Exam:    VS:  There were no vitals taken for this visit.    Wt Readings from Last 3 Encounters:  02/12/18 200 lb (90.7 kg)  02/05/18 200 lb (90.7 kg)  01/30/18 200 lb (90.7 kg)     GEN: *** Well nourished, well developed in no acute distress HEENT: Normal NECK: No JVD; No carotid bruits LYMPHATICS: No lymphadenopathy CARDIAC: ***RRR, no murmurs, rubs, gallops RESPIRATORY:  Clear to auscultation without rales, wheezing or rhonchi  ABDOMEN: Soft, non-tender, non-distended MUSCULOSKELETAL:  No edema; No deformity  SKIN: Warm and dry NEUROLOGIC:  Alert and oriented x 3 PSYCHIATRIC:  Normal affect   ASSESSMENT:    No diagnosis found. PLAN:    In order of problems listed above:  No diagnosis found.   Medication Adjustments/Labs and Tests Ordered: Current  medicines are reviewed at length with the patient today.  Concerns regarding medicines are outlined above.  No orders of the defined types were placed in this encounter.  No orders of the defined types were placed in this encounter.   Signed, Ledora Bottcher, [place]  02/27/2018 2:31 PM    [facility]

## 2018-02-27 ENCOUNTER — Ambulatory Visit: Payer: Self-pay | Admitting: Cardiology

## 2018-02-27 DIAGNOSIS — R0989 Other specified symptoms and signs involving the circulatory and respiratory systems: Secondary | ICD-10-CM

## 2018-03-02 ENCOUNTER — Encounter: Payer: Self-pay | Admitting: Cardiology

## 2018-03-07 ENCOUNTER — Encounter (HOSPITAL_COMMUNITY): Payer: Self-pay | Admitting: Emergency Medicine

## 2018-03-07 ENCOUNTER — Emergency Department (HOSPITAL_COMMUNITY)
Admission: EM | Admit: 2018-03-07 | Discharge: 2018-03-07 | Disposition: A | Discharge status: Home / Self Care | Payer: Self-pay | Attending: Emergency Medicine | Admitting: Emergency Medicine

## 2018-03-07 DIAGNOSIS — R21 Rash and other nonspecific skin eruption: Secondary | ICD-10-CM | POA: Insufficient documentation

## 2018-03-07 DIAGNOSIS — F1410 Cocaine abuse, uncomplicated: Secondary | ICD-10-CM | POA: Insufficient documentation

## 2018-03-07 DIAGNOSIS — Z79899 Other long term (current) drug therapy: Secondary | ICD-10-CM | POA: Insufficient documentation

## 2018-03-07 DIAGNOSIS — Z7982 Long term (current) use of aspirin: Secondary | ICD-10-CM | POA: Insufficient documentation

## 2018-03-07 DIAGNOSIS — R112 Nausea with vomiting, unspecified: Secondary | ICD-10-CM | POA: Insufficient documentation

## 2018-03-07 DIAGNOSIS — I10 Essential (primary) hypertension: Secondary | ICD-10-CM | POA: Insufficient documentation

## 2018-03-07 DIAGNOSIS — R197 Diarrhea, unspecified: Secondary | ICD-10-CM | POA: Insufficient documentation

## 2018-03-07 DIAGNOSIS — I2510 Atherosclerotic heart disease of native coronary artery without angina pectoris: Secondary | ICD-10-CM | POA: Insufficient documentation

## 2018-03-07 LAB — URINALYSIS, ROUTINE W REFLEX MICROSCOPIC
BACTERIA UA: NONE SEEN
Bilirubin Urine: NEGATIVE
Glucose, UA: NEGATIVE mg/dL
Hgb urine dipstick: NEGATIVE
Ketones, ur: NEGATIVE mg/dL
Nitrite: NEGATIVE
PROTEIN: NEGATIVE mg/dL
Specific Gravity, Urine: 1.021 (ref 1.005–1.030)
pH: 6 (ref 5.0–8.0)

## 2018-03-07 LAB — CBC
HCT: 38.8 % — ABNORMAL LOW (ref 39.0–52.0)
HEMOGLOBIN: 12.7 g/dL — AB (ref 13.0–17.0)
MCH: 26.8 pg (ref 26.0–34.0)
MCHC: 32.7 g/dL (ref 30.0–36.0)
MCV: 81.9 fL (ref 78.0–100.0)
Platelets: 146 10*3/uL — ABNORMAL LOW (ref 150–400)
RBC: 4.74 MIL/uL (ref 4.22–5.81)
RDW: 14.4 % (ref 11.5–15.5)
WBC: 7.9 10*3/uL (ref 4.0–10.5)

## 2018-03-07 LAB — COMPREHENSIVE METABOLIC PANEL
ALBUMIN: 3.5 g/dL (ref 3.5–5.0)
ALT: 32 U/L (ref 17–63)
ANION GAP: 8 (ref 5–15)
AST: 39 U/L (ref 15–41)
Alkaline Phosphatase: 72 U/L (ref 38–126)
BUN: 10 mg/dL (ref 6–20)
CO2: 26 mmol/L (ref 22–32)
Calcium: 9 mg/dL (ref 8.9–10.3)
Chloride: 105 mmol/L (ref 101–111)
Creatinine, Ser: 1.24 mg/dL (ref 0.61–1.24)
GFR calc Af Amer: >60 mL/min (ref >60)
GFR calc non Af Amer: >60 mL/min (ref >60)
GLUCOSE: 121 mg/dL — AB (ref 65–99)
Potassium: 3.7 mmol/L (ref 3.5–5.1)
SODIUM: 139 mmol/L (ref 135–145)
Total Bilirubin: 0.7 mg/dL (ref 0.3–1.2)
Total Protein: 6.7 g/dL (ref 6.5–8.1)

## 2018-03-07 LAB — LIPASE, BLOOD: Lipase: 30 U/L (ref 11–51)

## 2018-03-07 LAB — POC OCCULT BLOOD, ED: FECAL OCCULT BLD: NEGATIVE

## 2018-03-07 MED ORDER — HYDROXYZINE HCL 25 MG PO TABS: 25.0000 mg | ORAL_TABLET | Freq: Four times a day (QID) | ORAL | 0 refills | Status: DC

## 2018-03-07 MED ORDER — TRIAMCINOLONE ACETONIDE 0.1 % EX CREA: 1.0000 "application " | TOPICAL_CREAM | Freq: Two times a day (BID) | CUTANEOUS | 0 refills | Status: DC

## 2018-03-07 MED ORDER — ONDANSETRON HCL 4 MG PO TABS: 4.0000 mg | ORAL_TABLET | Freq: Four times a day (QID) | ORAL | 0 refills | Status: DC

## 2018-03-07 MED ORDER — IPRATROPIUM-ALBUTEROL 0.5-2.5 (3) MG/3ML IN SOLN: 3.0000 mL | Freq: Once | RESPIRATORY_TRACT | Status: DC

## 2018-03-07 MED ORDER — HYDROXYZINE HCL 25 MG PO TABS
25.0000 mg | ORAL_TABLET | Freq: Once | ORAL | Status: AC
  Administered 2018-03-07: 25 mg via ORAL
  Filled 2018-03-07: qty 1

## 2018-03-07 NOTE — ED Triage Notes (Signed)
Patient reports emesis x2 today with occasional diarrhea , pt. added itchy skin rashes at both hands onset this week . Denies abdominal pain/no fever or chills .

## 2018-03-07 NOTE — Discharge Instructions (Addendum)
Thank you for allowing me to provide your care today in the emergency department.  You can call [place] and wellness to get established with a primary care provider.  Please call when their office reopens on Monday morning to schedule follow-up appointment.  Please have them recheck your blood pressure in the office when he follow-up.  Take 1 tablet of hydroxyzine every 6 hours as needed for itching.  Apply a thin layer of triamcinolone cream to your rash up to 2 times daily.  Use caution if he will be out of the sun for long periods of time because this can cause skin discoloration.  Please make sure to stop using the cream after the rash improves.  If your rash does not improve within the next week, you can also follow-up with primary care.  Take 1 tablet of Zofran every 6 hours as needed for nausea or vomiting.  Make sure to continue to drink plenty of fluids to avoid dehydration.  Loperamide is available over-the-counter and may be used for diarrhea.  Return to the emergency department if you develop significant vomiting despite taking Zofran, high fever chills, shortness of breath, or other new concerning symptoms.

## 2018-03-07 NOTE — ED Notes (Signed)
Pt given [place] sandwich, crackers, peanut butter, and sprite per PA

## 2018-03-07 NOTE — ED Provider Notes (Signed)
[place] EMERGENCY DEPARTMENT Provider Note   CSN: [id number] Arrival date & time: 03/07/18  0027     History   Chief Complaint Chief Complaint  Patient presents with  . Emesis    HPI Peter Madden is a 47 y.o. male with a history of lymphoma in remission, HTN, cocaine use disorder, and CAD who presents to the emergency department with a chief complaint of rash.  The patient endorses a pruritic rash to the bilateral hands over the last 3 to 4 days.  The rash has not spread since onset.  No history of similar.  No new soaps, lotions, skin care products, sleeping environments, or detergents.  He has been using "some cream" on it at home that he purchased OTC with no improvement in his symptoms.  He denies fever, chills, chest pain, shortness of breath, blisters, or peeling of the skin.  He also reports 2 episodes of NBNB yesterday, nausea, and diarrhea.  Last episode of emesis was yesterday afternoon.  He has been able to eat and drink since then.  He states "I think I am dehydrated."  He also endorses one episode of hematochezia yesterday.  No history of similar or history of hemorrhoids.  He denies abdominal pain, dysuria, penile discharge, constipation, or back pain.  He reports that he takes lisinopril at home for his blood pressure.  He reports compliance with this medication.  Last dose was yesterday.  He denies headache, difficulty voiding, or visual changes.  No known sick contacts.  The history is provided by the patient. No language interpreter was used.  Emesis   Associated symptoms include diarrhea. Pertinent negatives include no abdominal pain, no chills, no cough, no fever and no headaches.    Past Medical History:  Diagnosis Date  . Coronary artery disease   . HTN (hypertension)   . Lymphoma [facility])     Patient Active Problem List   Diagnosis Date Noted  . Cocaine abuse with cocaine-induced psychotic disorder ([place]) 02/13/2018    . CAD (coronary artery disease) 03/07/2017  . Hypokalemia 03/07/2017  . Chest pain 03/07/2017  . Tobacco abuse 03/07/2017  . Essential hypertension 03/07/2017    Past Surgical History:  Procedure Laterality Date  . CARDIAC SURGERY    . KNEE SURGERY Right         Home Medications    Prior to Admission medications   Medication Sig Start Date End Date Taking? Authorizing Provider  aspirin 81 MG chewable tablet Chew 1 tablet (81 mg total) by mouth daily. 03/09/17  Yes Velvet Bathe, MD  lisinopril (PRINIVIL,ZESTRIL) 10 MG tablet Take 1 tablet (10 mg total) by mouth daily. 08/26/17  Yes Petrucelli, Samantha R, PA-C  amLODipine (NORVASC) 10 MG tablet Take 1 tablet (10 mg total) by mouth daily. Patient not taking: Reported on 02/13/2018 08/26/17   Petrucelli, Glynda Jaeger, PA-C  atorvastatin (LIPITOR) 80 MG tablet Take 1 tablet (80 mg total) by mouth daily at 6 PM. Patient not taking: Reported on 02/13/2018 03/09/17   Velvet Bathe, MD  hydrOXYzine (ATARAX/VISTARIL) 25 MG tablet Take 1 tablet (25 mg total) by mouth every 6 (six) hours. 03/07/18   Zyir Gassert A, PA-C  metoprolol tartrate (LOPRESSOR) 25 MG tablet Take 0.5 tablets (12.5 mg total) by mouth 2 (two) times daily. Patient not taking: Reported on 02/13/2018 08/26/17   Petrucelli, Samantha R, PA-C  ondansetron (ZOFRAN) 4 MG tablet Take 1 tablet (4 mg total) by mouth every 6 (six) hours. 03/07/18  Lavontae Cornia A, PA-C  triamcinolone cream (KENALOG) 0.1 % Apply 1 application topically 2 (two) times daily. 03/07/18   Azim Gillingham A, PA-C    Family History Family History  Problem Relation Age of Onset  . Diabetes Mother   . CAD Father   . Testicular cancer Father   . CAD Other   . Stroke Other   . Brain cancer Other     Social History Social History   Tobacco Use  . Smoking status: Smoker, Current Status Unknown  . Smokeless tobacco: Never Used  Substance Use Topics  . Alcohol use: No  . Drug use: Yes    Types: Cocaine      Allergies   Shellfish allergy and Penicillins   Review of Systems Review of Systems  Constitutional: Negative for appetite change, chills and fever.  Eyes: Negative for visual disturbance.  Respiratory: Negative for cough and shortness of breath.   Cardiovascular: Negative for chest pain.  Gastrointestinal: Positive for diarrhea, nausea and vomiting. Negative for abdominal pain.  Genitourinary: Negative for dysuria, frequency, penile pain, penile swelling and scrotal swelling.  Musculoskeletal: Negative for back pain.  Skin: Positive for rash.  Allergic/Immunologic: Negative for immunocompromised state.  Neurological: Negative for headaches.  Psychiatric/Behavioral: Negative for confusion.     Physical Exam Updated Vital Signs BP (!) 165/114   Pulse 72   Temp 97.8 F (36.6 C) (Oral)   Resp 16   SpO2 96%   Physical Exam  Constitutional: He appears well-developed.  Well-appearing.  No acute distress.  HENT:  Head: Normocephalic.  Eyes: Conjunctivae are normal.  Neck: Neck supple.  Cardiovascular: Normal rate, regular rhythm, normal heart sounds and intact distal pulses. Exam reveals no gallop and no friction rub.  No murmur heard. Pulmonary/Chest: Effort normal. No stridor. No respiratory distress. He has wheezes. He has no rales. He exhibits no tenderness.  Abdominal: Soft. Bowel sounds are normal. He exhibits no distension and no mass. There is no tenderness. There is no rebound and no guarding. No hernia.  Abdomen is soft, nontender, nondistended.  Benign exam.  No CVA tenderness bilaterally.  Genitourinary:  Genitourinary Comments: Chaperoned exam.  No external hemorrhoids are noted.  No gross blood on DRE.  No skin tears, sinus tracts, or fissures.  Prostate is nontender.  Musculoskeletal: He exhibits no edema, tenderness or deformity.  Neurological: He is alert.  Skin: Skin is warm and dry. Rash noted.  Maculopapular rash noted to the dorsum of the hand and  fingers bilerally. Lesions are diffusely distributed. No burrows or lesions to the interdigital space.  No desquamation, scaling, excoriation, bulla, pustules, erythema, induration, or fluctuance.   Psychiatric: His behavior is normal.  Nursing note and vitals reviewed.  ED Treatments / Results  Labs (all labs ordered are listed, but only abnormal results are displayed) Labs Reviewed  COMPREHENSIVE METABOLIC PANEL - Abnormal; Notable for the following components:      Result Value   Glucose, Bld 121 (*)    All other components within normal limits  CBC - Abnormal; Notable for the following components:   Hemoglobin 12.7 (*)    HCT 38.8 (*)    Platelets 146 (*)    All other components within normal limits  URINALYSIS, ROUTINE W REFLEX MICROSCOPIC - Abnormal; Notable for the following components:   Leukocytes, UA MODERATE (*)    All other components within normal limits  LIPASE, BLOOD  POC OCCULT BLOOD, ED    EKG None  Radiology No results  found.  Procedures Procedures (including critical care time)  Medications Ordered in ED Medications  hydrOXYzine (ATARAX/VISTARIL) tablet 25 mg (25 mg Oral Given 03/07/18 8127)     Initial Impression / Assessment and Plan / ED Course  I have reviewed the triage vital signs and the nursing notes.  Pertinent labs & imaging results that were available during my care of the patient were reviewed by me and considered in my medical decision making (see chart for details).     47 year old with a history of lymphoma in remission, HTN, cocaine use disorder, and CAD who presents to the emergency department with a chief complaint of rash for 3-4 days, N/V/D since yesterday.  He also reports one episode of hematochezia yesterday.  BP 177/123 on arrival, improving to 165/114.   Abdominal exam is benign.  DRE is unremarkable and Hemoccult is negative.  He has no complaints of endorgan disease.  Labs are reviewed and reassuring.  He has been successfully  fluid challenged in the ED.  He is asking for breakfast.  No signs or symptoms of dehydration clinically. Patient does not meet the SIRS or Sepsis criteria.  On repeat exam patient does not have a surgical abdomen and there are no peritoneal signs.  No indication of appendicitis, bowel obstruction, bowel perforation, cholecystitis, diverticulitis.    He also has a maculopapular rash to the dorsum of the bilateral hands and fingers. Rash consistent with contact dermatitis. Patient denies any difficulty breathing or swallowing.  Pt has a patent airway without stridor and is handling secretions without difficulty; no angioedema. No blisters, no pustules, no warmth, no draining sinus tracts, no superficial abscesses, no bullous impetigo, no vesicles, no desquamation, no target lesions with dusky purpura or a central bulla. Not tender to touch. No concern for superimposed infection. No concern for SJS, TEN, TSS, tick borne illness, syphilis or other life-threatening condition.  On reevaluation, patient is asking to sleep longer in the ED before discharge. Patient discharged home with symptomatic treatment and given strict instructions for follow-up with their primary care physician to have his blood pressure rechecked.  I have also discussed reasons to return immediately to the ER.  Patient expresses understanding and agrees with plan.  Final Clinical Impressions(s) / ED Diagnoses   Final diagnoses:  Rash and nonspecific skin eruption  Nausea vomiting and diarrhea    ED Discharge Orders        Ordered    hydrOXYzine (ATARAX/VISTARIL) 25 MG tablet  Every 6 hours     03/07/18 0857    triamcinolone cream (KENALOG) 0.1 %  2 times daily     03/07/18 0857    ondansetron (ZOFRAN) 4 MG tablet  Every 6 hours     03/07/18 0857       Joline Maxcy A, PA-C 03/07/18 5170    Duffy Bruce, MD 03/08/18 [phone]

## 2018-03-23 ENCOUNTER — Emergency Department (HOSPITAL_COMMUNITY)
Admission: EM | Admit: 2018-03-23 | Discharge: 2018-03-24 | Disposition: A | Discharge status: Home / Self Care | Payer: Self-pay | Attending: Emergency Medicine | Admitting: Emergency Medicine

## 2018-03-23 ENCOUNTER — Other Ambulatory Visit: Payer: Self-pay

## 2018-03-23 DIAGNOSIS — Z7982 Long term (current) use of aspirin: Secondary | ICD-10-CM | POA: Insufficient documentation

## 2018-03-23 DIAGNOSIS — R21 Rash and other nonspecific skin eruption: Secondary | ICD-10-CM | POA: Insufficient documentation

## 2018-03-23 DIAGNOSIS — Z8571 Personal history of Hodgkin lymphoma: Secondary | ICD-10-CM | POA: Insufficient documentation

## 2018-03-23 DIAGNOSIS — R197 Diarrhea, unspecified: Secondary | ICD-10-CM | POA: Insufficient documentation

## 2018-03-23 DIAGNOSIS — F17200 Nicotine dependence, unspecified, uncomplicated: Secondary | ICD-10-CM | POA: Insufficient documentation

## 2018-03-23 DIAGNOSIS — Z79899 Other long term (current) drug therapy: Secondary | ICD-10-CM | POA: Insufficient documentation

## 2018-03-23 DIAGNOSIS — I2510 Atherosclerotic heart disease of native coronary artery without angina pectoris: Secondary | ICD-10-CM | POA: Insufficient documentation

## 2018-03-23 DIAGNOSIS — I10 Essential (primary) hypertension: Secondary | ICD-10-CM | POA: Insufficient documentation

## 2018-03-23 LAB — COMPREHENSIVE METABOLIC PANEL
ALK PHOS: 75 U/L (ref 38–126)
ALT: 19 U/L (ref 17–63)
AST: 25 U/L (ref 15–41)
Albumin: 4 g/dL (ref 3.5–5.0)
Anion gap: 10 (ref 5–15)
BUN: 10 mg/dL (ref 6–20)
CALCIUM: 9.4 mg/dL (ref 8.9–10.3)
CHLORIDE: 106 mmol/L (ref 101–111)
CO2: 25 mmol/L (ref 22–32)
Creatinine, Ser: 1.5 mg/dL — ABNORMAL HIGH (ref 0.61–1.24)
GFR calc Af Amer: >60 mL/min (ref >60)
GFR calc non Af Amer: 54 mL/min — ABNORMAL LOW (ref >60)
GLUCOSE: 130 mg/dL — AB (ref 65–99)
Potassium: 3.8 mmol/L (ref 3.5–5.1)
SODIUM: 141 mmol/L (ref 135–145)
TOTAL PROTEIN: 7.3 g/dL (ref 6.5–8.1)
Total Bilirubin: 0.5 mg/dL (ref 0.3–1.2)

## 2018-03-23 LAB — CBC
HEMATOCRIT: 43.8 % (ref 39.0–52.0)
HEMOGLOBIN: 13.7 g/dL (ref 13.0–17.0)
MCH: 26.2 pg (ref 26.0–34.0)
MCHC: 31.3 g/dL (ref 30.0–36.0)
MCV: 83.7 fL (ref 78.0–100.0)
Platelets: 168 10*3/uL (ref 150–400)
RBC: 5.23 MIL/uL (ref 4.22–5.81)
RDW: 14.1 % (ref 11.5–15.5)
WBC: 5.7 10*3/uL (ref 4.0–10.5)

## 2018-03-23 LAB — LIPASE, BLOOD: Lipase: 32 U/L (ref 11–51)

## 2018-03-23 NOTE — ED Triage Notes (Signed)
Pt states he has a rash to his arms and stomach, currently itching his whole body during triage pt also states he has intermittent diarrhea for 2 months. No abdominal pain. Pt was seen for same 6/8.

## 2018-03-24 MED ORDER — LISINOPRIL 10 MG PO TABS: 10.0000 mg | ORAL_TABLET | Freq: Every day | ORAL | 0 refills | Status: AC

## 2018-03-24 MED ORDER — LISINOPRIL 10 MG PO TABS
10.0000 mg | ORAL_TABLET | Freq: Once | ORAL | Status: AC
  Administered 2018-03-24: 10 mg via ORAL
  Filled 2018-03-24: qty 1

## 2018-03-24 MED ORDER — DIPHENHYDRAMINE HCL 25 MG PO TABS: 25.0000 mg | ORAL_TABLET | Freq: Four times a day (QID) | ORAL | 0 refills | Status: DC | PRN

## 2018-03-24 MED ORDER — PREDNISONE 20 MG PO TABS: 40.0000 mg | ORAL_TABLET | Freq: Every day | ORAL | 0 refills | Status: DC

## 2018-03-24 MED ORDER — DEXAMETHASONE SODIUM PHOSPHATE 10 MG/ML IJ SOLN
10.0000 mg | Freq: Once | INTRAMUSCULAR | Status: AC
  Administered 2018-03-24: 10 mg via INTRAMUSCULAR
  Filled 2018-03-24: qty 1

## 2018-03-24 MED ORDER — DIPHENHYDRAMINE HCL 25 MG PO CAPS
25.0000 mg | ORAL_CAPSULE | Freq: Once | ORAL | Status: AC
  Administered 2018-03-24: 25 mg via ORAL
  Filled 2018-03-24: qty 1

## 2018-03-24 NOTE — ED Provider Notes (Signed)
[place] EMERGENCY DEPARTMENT Provider Note   CSN: [id number] Arrival date & time: 03/23/18  2216     History   Chief Complaint Chief Complaint  Patient presents with  . Rash  . Diarrhea    HPI Peter Madden is a 47 y.o. male.  Patient presents to the emergency department with a chief complaint of rash.  He states that he has had a rash on the backs of his hands for the past 2 weeks.  He states that he has been out in the yard.  He states that the rash is very itchy.  He denies any fevers, chills, nausea, vomiting.  He states that he has had some diarrhea.  He denies any abdominal pain.  He denies any blood in his stools.  He has tried using psoriasis cream on the rash with no relief.  He denies any known bug bites.  Denies any rash on his extremities other than his hands, denies any rash on his torso.  The history is provided by the patient. No language interpreter was used.    Past Medical History:  Diagnosis Date  . Coronary artery disease   . HTN (hypertension)   . Lymphoma [facility])     Patient Active Problem List   Diagnosis Date Noted  . 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 CAD (coronary artery disease) 03/07/2017  . Hypokalemia 03/07/2017  . Chest pain 03/07/2017  . Tobacco abuse 03/07/2017  . Essential hypertension 03/07/2017    Past Surgical History:  Procedure Laterality Date  . CARDIAC SURGERY    . KNEE SURGERY Right         Home Medications    Prior to Admission medications   Medication Sig Start Date End Date Taking? Authorizing Provider  amLODipine (NORVASC) 10 MG tablet Take 1 tablet (10 mg total) by mouth daily. Patient not taking: Reported on 02/13/2018 08/26/17   Petrucelli, Glynda Jaeger, PA-C  aspirin 81 MG chewable tablet Chew 1 tablet (81 mg total) by mouth daily. 03/09/17   Velvet Bathe, MD  atorvastatin (LIPITOR) 80 MG tablet Take 1 tablet (80 mg total) by mouth daily  at 6 PM. Patient not taking: Reported on 02/13/2018 03/09/17   Velvet Bathe, MD  hydrOXYzine (ATARAX/VISTARIL) 25 MG tablet Take 1 tablet (25 mg total) by mouth every 6 (six) hours. 03/07/18   McDonald, Mia A, PA-C  lisinopril (PRINIVIL,ZESTRIL) 10 MG tablet Take 1 tablet (10 mg total) by mouth daily. 08/26/17   Petrucelli, Samantha R, PA-C  metoprolol tartrate (LOPRESSOR) 25 MG tablet Take 0.5 tablets (12.5 mg total) by mouth 2 (two) times daily. Patient not taking: Reported on 02/13/2018 08/26/17   Petrucelli, Samantha R, PA-C  ondansetron (ZOFRAN) 4 MG tablet Take 1 tablet (4 mg total) by mouth every 6 (six) hours. 03/07/18   McDonald, Mia A, PA-C  triamcinolone cream (KENALOG) 0.1 % Apply 1 application topically 2 (two) times daily. 03/07/18   McDonald, Mia A, PA-C    Family History Family History  Problem Relation Age of Onset  . Diabetes Mother   . CAD Father   . Testicular cancer Father   . CAD Other   . Stroke Other   . Brain cancer Other     Social History Social History   Tobacco Use  . Smoking status: Smoker, Current Status Unknown  . Smokeless tobacco: Never Used  Substance Use Topics  . Alcohol use: No  . Drug use: Yes  Types: Cocaine     Allergies   Shellfish allergy and Penicillins   Review of Systems Review of Systems  All other systems reviewed and are negative.    Physical Exam Updated Vital Signs BP (!) 184/100 (BP Location: Right Arm)   Pulse 85   Temp 98 F (36.7 C) (Oral)   Resp 16   SpO2 96%   Physical Exam  Constitutional: He is oriented to person, place, and time. He appears well-developed and well-nourished.  HENT:  Head: Normocephalic and atraumatic.  Eyes: Pupils are equal, round, and reactive to light. Conjunctivae and EOM are normal. Right eye exhibits no discharge. Left eye exhibits no discharge. No scleral icterus.  Neck: Normal range of motion. Neck supple. No JVD present.  Cardiovascular: Normal rate, regular rhythm and normal  heart sounds. Exam reveals no gallop and no friction rub.  No murmur heard. Pulmonary/Chest: Effort normal and breath sounds normal. No respiratory distress. He has no wheezes. He has no rales. He exhibits no tenderness.  Abdominal: Soft. He exhibits no distension and no mass. There is no tenderness. There is no rebound and no guarding.  Abdomen soft nontender  Musculoskeletal: Normal range of motion. He exhibits no edema or tenderness.  Neurological: He is alert and oriented to person, place, and time.  Skin: Skin is warm and dry.  Dry skin with some excoriations on the backs of his hands  Psychiatric: He has a normal mood and affect. His behavior is normal. Judgment and thought content normal.  Nursing note and vitals reviewed.    ED Treatments / Results  Labs (all labs ordered are listed, but only abnormal results are displayed) Labs Reviewed  COMPREHENSIVE METABOLIC PANEL - Abnormal; Notable for the following components:      Result Value   Glucose, Bld 130 (*)    Creatinine, Ser 1.50 (*)    GFR calc non Af Amer 54 (*)    All other components within normal limits  LIPASE, BLOOD  CBC    EKG None  Radiology No results found.  Procedures Procedures (including critical care time)  Medications Ordered in ED Medications  diphenhydrAMINE (BENADRYL) capsule 25 mg (25 mg Oral Given 03/24/18 0401)  dexamethasone (DECADRON) injection 10 mg (10 mg Intramuscular Given 03/24/18 0401)     Initial Impression / Assessment and Plan / ED Course  I have reviewed the triage vital signs and the nursing notes.  Pertinent labs & imaging results that were available during my care of the patient were reviewed by me and considered in my medical decision making (see chart for details).     Patient with rash on the backs of his hands.  There is no involvement of the palms.  There is no rash elsewhere on the body.  Question contact dermatitis, as he has been out working in the yard.  Will give  Benadryl and Decadron.  Regarding the patient's diarrhea, his vital signs are stable.  He is in no acute distress.  His abdomen is soft and nontender.  Recommend watchful waiting.  Recommend PCP follow-up if no improvement in 2 weeks.  Final Clinical Impressions(s) / ED Diagnoses   Final diagnoses:  Rash  Diarrhea, unspecified type    ED Discharge Orders        Ordered    predniSONE (DELTASONE) 20 MG tablet  Daily     03/24/18 0429    diphenhydrAMINE (BENADRYL) 25 MG tablet  Every 6 hours PRN     03/24/18 0429  Montine Circle, PA-C 03/24/18 0430    Merryl Hacker, MD 03/24/18 [phone]

## 2018-03-30 ENCOUNTER — Emergency Department (HOSPITAL_COMMUNITY)
Admission: EM | Admit: 2018-03-30 | Discharge: 2018-03-31 | Disposition: A | Discharge status: Home / Self Care | Payer: Self-pay | Attending: Emergency Medicine | Admitting: Emergency Medicine

## 2018-03-30 ENCOUNTER — Encounter (HOSPITAL_COMMUNITY): Payer: Self-pay | Admitting: *Deleted

## 2018-03-30 ENCOUNTER — Other Ambulatory Visit: Payer: Self-pay

## 2018-03-30 DIAGNOSIS — R21 Rash and other nonspecific skin eruption: Secondary | ICD-10-CM | POA: Insufficient documentation

## 2018-03-30 DIAGNOSIS — Z79899 Other long term (current) drug therapy: Secondary | ICD-10-CM | POA: Insufficient documentation

## 2018-03-30 DIAGNOSIS — Z7982 Long term (current) use of aspirin: Secondary | ICD-10-CM | POA: Insufficient documentation

## 2018-03-30 DIAGNOSIS — I2510 Atherosclerotic heart disease of native coronary artery without angina pectoris: Secondary | ICD-10-CM | POA: Insufficient documentation

## 2018-03-30 DIAGNOSIS — I10 Essential (primary) hypertension: Secondary | ICD-10-CM | POA: Insufficient documentation

## 2018-03-30 NOTE — ED Triage Notes (Signed)
Pt has had a rash to hands and legs for the past month. Has been seen here for the same and given steroid shot with helped rash improve for a couple of days then returned

## 2018-03-31 MED ORDER — DEXAMETHASONE SODIUM PHOSPHATE 10 MG/ML IJ SOLN
10.0000 mg | Freq: Once | INTRAMUSCULAR | Status: AC
Start: 2018-03-31 — End: 2018-03-31
  Administered 2018-03-31: 10 mg via INTRAMUSCULAR
  Filled 2018-03-31: qty 1

## 2018-03-31 MED ORDER — PERMETHRIN 1 % EX LOTN
TOPICAL_LOTION | Freq: Once | CUTANEOUS | Status: AC
  Administered 2018-03-31: 03:00:00 via TOPICAL
  Filled 2018-03-31: qty 59

## 2018-03-31 NOTE — Discharge Instructions (Addendum)
Use medicated lotion as prescribed. It is helpful to have a primary care physician for recheck if symptoms recur.

## 2018-03-31 NOTE — ED Provider Notes (Signed)
[place] EMERGENCY DEPARTMENT Provider Note   CSN: [id number] Arrival date & time: 03/30/18  2315     History   Chief Complaint Chief Complaint  Patient presents with  . Rash    HPI Peter Madden is a 47 y.o. male.  Patient returns with persistent rash that started on his bilateral dorsal hands and has now spread to his lower legs. The rash itches, no pain. It is a dry rash without wetness or drainage. No SOB, facial swelling, fever or symptoms of systemic illness. Rash started about 3 weeks ago. When seen on 6/25.19 in the emergency department he was given a steroid which provided temporary relief. He has not arranged the recommended outpatient follow up.  The history is provided by the patient. No language interpreter was used.  Rash      Past Medical History:  Diagnosis Date  . Coronary artery disease   . HTN (hypertension)   . Lymphoma [facility])     Patient Active Problem List   Diagnosis Date Noted  . 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 CAD (coronary artery disease) 03/07/2017  . Hypokalemia 03/07/2017  . Chest pain 03/07/2017  . Tobacco abuse 03/07/2017  . Essential hypertension 03/07/2017    Past Surgical History:  Procedure Laterality Date  . CARDIAC SURGERY    . KNEE SURGERY Right         Home Medications    Prior to Admission medications   Medication Sig Start Date End Date Taking? Authorizing Provider  amLODipine (NORVASC) 10 MG tablet Take 1 tablet (10 mg total) by mouth daily. Patient not taking: Reported on 02/13/2018 08/26/17   Petrucelli, Glynda Jaeger, PA-C  aspirin 81 MG chewable tablet Chew 1 tablet (81 mg total) by mouth daily. 03/09/17   Velvet Bathe, MD  atorvastatin (LIPITOR) 80 MG tablet Take 1 tablet (80 mg total) by mouth daily at 6 PM. Patient not taking: Reported on 02/13/2018 03/09/17   Velvet Bathe, MD  diphenhydrAMINE (BENADRYL) 25 MG tablet Take 1 tablet (25  mg total) by mouth every 6 (six) hours as needed. 03/24/18   Montine Circle, PA-C  hydrOXYzine (ATARAX/VISTARIL) 25 MG tablet Take 1 tablet (25 mg total) by mouth every 6 (six) hours. 03/07/18   McDonald, Mia A, PA-C  lisinopril (PRINIVIL,ZESTRIL) 10 MG tablet Take 1 tablet (10 mg total) by mouth daily. 03/24/18   Montine Circle, PA-C  metoprolol tartrate (LOPRESSOR) 25 MG tablet Take 0.5 tablets (12.5 mg total) by mouth 2 (two) times daily. Patient not taking: Reported on 02/13/2018 08/26/17   Petrucelli, Samantha R, PA-C  ondansetron (ZOFRAN) 4 MG tablet Take 1 tablet (4 mg total) by mouth every 6 (six) hours. 03/07/18   McDonald, Mia A, PA-C  predniSONE (DELTASONE) 20 MG tablet Take 2 tablets (40 mg total) by mouth daily. 03/24/18   Montine Circle, PA-C  triamcinolone cream (KENALOG) 0.1 % Apply 1 application topically 2 (two) times daily. 03/07/18   McDonald, Mia A, PA-C    Family History Family History  Problem Relation Age of Onset  . Diabetes Mother   . CAD Father   . Testicular cancer Father   . CAD Other   . Stroke Other   . Brain cancer Other     Social History Social History   Tobacco Use  . Smoking status: Smoker, Current Status Unknown  . Smokeless tobacco: Never Used  Substance Use Topics  . Alcohol use: No  . Drug use:  Yes    Types: Cocaine     Allergies   Shellfish allergy and Penicillins   Review of Systems Review of Systems  Constitutional: Negative for chills and fever.  HENT: Negative.   Respiratory: Negative.   Cardiovascular: Negative.   Gastrointestinal: Negative.   Musculoskeletal: Negative.   Skin: Positive for rash.  Neurological: Negative.      Physical Exam Updated Vital Signs BP (!) 140/108   Pulse 93   Temp 98.8 F (37.1 C)   Resp 18   SpO2 99%   Physical Exam  Constitutional: He is oriented to person, place, and time. He appears well-developed and well-nourished.  Neck: Normal range of motion.  Pulmonary/Chest: Effort normal.    Musculoskeletal: Normal range of motion.  Neurological: He is alert and oriented to person, place, and time.  Skin: Skin is warm and dry.  Hands have the appearance of excessively dry skin with excoriation. There is slight erythema. No rash visualized to lower legs. No swelling of either area.   Psychiatric: He has a normal mood and affect.     ED Treatments / Results  Labs (all labs ordered are listed, but only abnormal results are displayed) Labs Reviewed - No data to display  EKG None  Radiology No results found.  Procedures Procedures (including critical care time)  Medications Ordered in ED Medications - No data to display   Initial Impression / Assessment and Plan / ED Course  I have reviewed the triage vital signs and the nursing notes.  Pertinent labs & imaging results that were available during my care of the patient were reviewed by me and considered in my medical decision making (see chart for details).     Patient here with rash x 3 weeks, getting worse, spreading to LE's from original location on bilateral hands. No history of eczema or other recurrent or chronic rash. No family members affected.  Do not feel this is contact dermatitis at this point. The rash extends to the interphalangeal spaces, is slight raised and now spreading to lower extremities. Consider scabies and recommend use of Elimite. He is requesting another dose of Decadron because of the relief he obtained before with was provided. Strongly encouraged outpatient evaluation.  Final Clinical Impressions(s) / ED Diagnoses   Final diagnoses:  None   1 rash   ED Discharge Orders    None       Charlann Lange, PA-C 03/31/18 0216    Margette Fast, MD 03/31/18 [phone]

## 2018-04-04 ENCOUNTER — Encounter (HOSPITAL_COMMUNITY): Payer: Self-pay | Admitting: Emergency Medicine

## 2018-04-04 ENCOUNTER — Emergency Department (HOSPITAL_COMMUNITY)
Admission: EM | Admit: 2018-04-04 | Discharge: 2018-04-04 | Disposition: A | Discharge status: Home / Self Care | Payer: Self-pay | Attending: Emergency Medicine | Admitting: Emergency Medicine

## 2018-04-04 ENCOUNTER — Other Ambulatory Visit: Payer: Self-pay

## 2018-04-04 DIAGNOSIS — Z79899 Other long term (current) drug therapy: Secondary | ICD-10-CM | POA: Insufficient documentation

## 2018-04-04 DIAGNOSIS — R21 Rash and other nonspecific skin eruption: Secondary | ICD-10-CM | POA: Insufficient documentation

## 2018-04-04 DIAGNOSIS — I10 Essential (primary) hypertension: Secondary | ICD-10-CM | POA: Insufficient documentation

## 2018-04-04 DIAGNOSIS — I2510 Atherosclerotic heart disease of native coronary artery without angina pectoris: Secondary | ICD-10-CM | POA: Insufficient documentation

## 2018-04-04 DIAGNOSIS — F17200 Nicotine dependence, unspecified, uncomplicated: Secondary | ICD-10-CM | POA: Insufficient documentation

## 2018-04-04 DIAGNOSIS — Z8572 Personal history of non-Hodgkin lymphomas: Secondary | ICD-10-CM | POA: Insufficient documentation

## 2018-04-04 DIAGNOSIS — Z7982 Long term (current) use of aspirin: Secondary | ICD-10-CM | POA: Insufficient documentation

## 2018-04-04 MED ORDER — DEXAMETHASONE SODIUM PHOSPHATE 10 MG/ML IJ SOLN
10.0000 mg | Freq: Once | INTRAMUSCULAR | Status: AC
  Administered 2018-04-04: 10 mg via INTRAMUSCULAR
  Filled 2018-04-04: qty 1

## 2018-04-04 NOTE — Discharge Instructions (Signed)
Please establish care with a primary doctor

## 2018-04-04 NOTE — ED Notes (Signed)
Pt called for triage but no answer in the lobby

## 2018-04-04 NOTE — ED Provider Notes (Signed)
[place] EMERGENCY DEPARTMENT Provider Note   CSN: [id number] Arrival date & time: 04/04/18  0017     History   Chief Complaint Chief Complaint  Patient presents with  . Rash    HPI Peter Madden is a 47 y.o. male who presents with a rash. PMH significant for cocaine abuse, HTN, frequent ED visits. He reports an ongoing rash on the dorsum of his bilateral hands for several weeks. He is unsure of what caused this. The rash is itchy. It got better with a steroids shot. It did not improve with Elimite. He is requesting another shot. He denies fever or SOB. He denies the rash is anywhere else other than his hands. He has not set up an appointment with a primary doctor because he doesn't have insurance. When asked if he needs assistance setting up an appointment at [facility] health [facility] he states "nah".  HPI  Past Medical History:  Diagnosis Date  . Coronary artery disease   . HTN (hypertension)   . Lymphoma [facility])     Patient Active Problem List   Diagnosis Date Noted  . 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 CAD (coronary artery disease) 03/07/2017  . Hypokalemia 03/07/2017  . Chest pain 03/07/2017  . Tobacco abuse 03/07/2017  . Essential hypertension 03/07/2017    Past Surgical History:  Procedure Laterality Date  . CARDIAC SURGERY    . KNEE SURGERY Right         Home Medications    Prior to Admission medications   Medication Sig Start Date End Date Taking? Authorizing Provider  amLODipine (NORVASC) 10 MG tablet Take 1 tablet (10 mg total) by mouth daily. Patient not taking: Reported on 02/13/2018 08/26/17   Petrucelli, Glynda Jaeger, PA-C  aspirin 81 MG chewable tablet Chew 1 tablet (81 mg total) by mouth daily. 03/09/17   Velvet Bathe, MD  atorvastatin (LIPITOR) 80 MG tablet Take 1 tablet (80 mg total) by mouth daily at 6 PM. Patient not taking: Reported on 02/13/2018 03/09/17   Velvet Bathe,  MD  diphenhydrAMINE (BENADRYL) 25 MG tablet Take 1 tablet (25 mg total) by mouth every 6 (six) hours as needed. 03/24/18   Montine Circle, PA-C  hydrOXYzine (ATARAX/VISTARIL) 25 MG tablet Take 1 tablet (25 mg total) by mouth every 6 (six) hours. 03/07/18   McDonald, Mia A, PA-C  lisinopril (PRINIVIL,ZESTRIL) 10 MG tablet Take 1 tablet (10 mg total) by mouth daily. 03/24/18   Montine Circle, PA-C  metoprolol tartrate (LOPRESSOR) 25 MG tablet Take 0.5 tablets (12.5 mg total) by mouth 2 (two) times daily. Patient not taking: Reported on 02/13/2018 08/26/17   Petrucelli, Samantha R, PA-C  ondansetron (ZOFRAN) 4 MG tablet Take 1 tablet (4 mg total) by mouth every 6 (six) hours. 03/07/18   McDonald, Mia A, PA-C  predniSONE (DELTASONE) 20 MG tablet Take 2 tablets (40 mg total) by mouth daily. 03/24/18   Montine Circle, PA-C  triamcinolone cream (KENALOG) 0.1 % Apply 1 application topically 2 (two) times daily. 03/07/18   McDonald, Mia A, PA-C    Family History Family History  Problem Relation Age of Onset  . Diabetes Mother   . CAD Father   . Testicular cancer Father   . CAD Other   . Stroke Other   . Brain cancer Other     Social History Social History   Tobacco Use  . Smoking status: Smoker, Current Status Unknown  . Smokeless tobacco: Never  Used  Substance Use Topics  . Alcohol use: No  . Drug use: Yes    Types: Cocaine     Allergies   Shellfish allergy and Penicillins   Review of Systems Review of Systems  Constitutional: Negative for fever.  Skin: Positive for rash.     Physical Exam Updated Vital Signs BP (!) 178/110 (BP Location: Right Arm)   Pulse 76   Temp 98.3 F (36.8 C) (Oral)   Resp 16   SpO2 99%   Physical Exam  Constitutional: He is oriented to person, place, and time. He appears well-developed and well-nourished. No distress.  HENT:  Head: Normocephalic and atraumatic.  Eyes: Pupils are equal, round, and reactive to light. Conjunctivae are normal. Right  eye exhibits no discharge. Left eye exhibits no discharge. No scleral icterus.  Neck: Normal range of motion.  Cardiovascular: Normal rate.  Pulmonary/Chest: Effort normal. No respiratory distress.  Abdominal: He exhibits no distension.  Neurological: He is alert and oriented to person, place, and time.  Skin: Skin is warm and dry. Rash (Mild erythema and several excoriations over the dorsum of the bilateral hands extending to the wrist) noted.  Psychiatric: He has a normal mood and affect. His behavior is normal.  Nursing note and vitals reviewed.    ED Treatments / Results  Labs (all labs ordered are listed, but only abnormal results are displayed) Labs Reviewed - No data to display  EKG None  Radiology No results found.  Procedures Procedures (including critical care time)  Medications Ordered in ED Medications  dexamethasone (DECADRON) injection 10 mg (10 mg Intramuscular Given 04/04/18 2703)     Initial Impression / Assessment and Plan / ED Course  I have reviewed the triage vital signs and the nursing notes.  Pertinent labs & imaging results that were available during my care of the patient were reviewed by me and considered in my medical decision making (see chart for details).  47 year old male presents with a non-specific rash which has been ongoing for several weeks. He has been seen for this multiple times. He reports improvement with steroids. His rash is not very impressive. He has poor outpatient up and was offered consult with CM which he declined.  Final Clinical Impressions(s) / ED Diagnoses   Final diagnoses:  Rash and nonspecific skin eruption    ED Discharge Orders    None       Recardo Evangelist, PA-C 04/04/18 5009    Drenda Freeze, MD 04/04/18 [phone]

## 2018-04-04 NOTE — ED Triage Notes (Addendum)
C/o rash to bilateral hands x 1 month that he has been seen for in ED multiple times.  States he needs more medication.

## 2018-04-14 ENCOUNTER — Other Ambulatory Visit: Payer: Self-pay

## 2018-04-14 ENCOUNTER — Emergency Department (HOSPITAL_COMMUNITY): Admission: EM | Admit: 2018-04-14 | Discharge: 2018-04-14 | Discharge status: Against Medical Advice | Payer: Self-pay

## 2018-04-14 NOTE — ED Notes (Signed)
Pt called for triage no answer  °

## 2018-04-14 NOTE — ED Notes (Signed)
Pt called for triage. No answer.

## 2018-04-21 ENCOUNTER — Encounter (HOSPITAL_COMMUNITY): Payer: Self-pay | Admitting: Emergency Medicine

## 2018-04-21 ENCOUNTER — Other Ambulatory Visit: Payer: Self-pay

## 2018-04-21 ENCOUNTER — Emergency Department (HOSPITAL_COMMUNITY)
Admission: EM | Admit: 2018-04-21 | Discharge: 2018-04-21 | Disposition: A | Discharge status: Home / Self Care | Payer: Self-pay | Attending: Emergency Medicine | Admitting: Emergency Medicine

## 2018-04-21 ENCOUNTER — Emergency Department (HOSPITAL_COMMUNITY): Payer: Self-pay

## 2018-04-21 DIAGNOSIS — Z79899 Other long term (current) drug therapy: Secondary | ICD-10-CM | POA: Insufficient documentation

## 2018-04-21 DIAGNOSIS — B9789 Other viral agents as the cause of diseases classified elsewhere: Secondary | ICD-10-CM | POA: Insufficient documentation

## 2018-04-21 DIAGNOSIS — I2510 Atherosclerotic heart disease of native coronary artery without angina pectoris: Secondary | ICD-10-CM | POA: Insufficient documentation

## 2018-04-21 DIAGNOSIS — I10 Essential (primary) hypertension: Secondary | ICD-10-CM | POA: Insufficient documentation

## 2018-04-21 DIAGNOSIS — J069 Acute upper respiratory infection, unspecified: Secondary | ICD-10-CM | POA: Insufficient documentation

## 2018-04-21 DIAGNOSIS — Z7982 Long term (current) use of aspirin: Secondary | ICD-10-CM | POA: Insufficient documentation

## 2018-04-21 DIAGNOSIS — F17200 Nicotine dependence, unspecified, uncomplicated: Secondary | ICD-10-CM | POA: Insufficient documentation

## 2018-04-21 MED ORDER — ALBUTEROL SULFATE (2.5 MG/3ML) 0.083% IN NEBU
5.0000 mg | INHALATION_SOLUTION | Freq: Once | RESPIRATORY_TRACT | Status: AC
  Administered 2018-04-21: 5 mg via RESPIRATORY_TRACT
  Filled 2018-04-21: qty 6

## 2018-04-21 MED ORDER — IPRATROPIUM BROMIDE 0.02 % IN SOLN
0.5000 mg | Freq: Once | RESPIRATORY_TRACT | Status: AC
  Administered 2018-04-21: 0.5 mg via RESPIRATORY_TRACT
  Filled 2018-04-21: qty 2.5

## 2018-04-21 MED ORDER — ALBUTEROL SULFATE HFA 108 (90 BASE) MCG/ACT IN AERS
2.0000 | INHALATION_SPRAY | Freq: Once | RESPIRATORY_TRACT | Status: AC
  Administered 2018-04-21: 2 via RESPIRATORY_TRACT
  Filled 2018-04-21: qty 6.7

## 2018-04-21 NOTE — ED Provider Notes (Signed)
[facility] EMERGENCY DEPARTMENT Provider Note   CSN: [id number] Arrival date & time: 04/21/18  0013     History   Chief Complaint Chief Complaint  Patient presents with  . URI    HPI Peter Madden is a 47 y.o. male.     The history is provided by the patient and medical records.  URI   Associated symptoms include cough.     47 y.o. M with hx of CAD, HTN, lymphoma, presenting to the ED for cough, fever, fatigue, and generally not feeling well for the past few days.  Patient reports productive cough with thick mucous.  States several people where he stays have been sick with similar symptoms.  He reports subjective fevers and chills.  He is a daily smoker.  Reports history of asthma.  Not currently on medications for this.  Past Medical History:  Diagnosis Date  . Coronary artery disease   . HTN (hypertension)   . Lymphoma [facility])     Patient Active Problem List   Diagnosis Date Noted  . 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 CAD (coronary artery disease) 03/07/2017  . Hypokalemia 03/07/2017  . Chest pain 03/07/2017  . Tobacco abuse 03/07/2017  . Essential hypertension 03/07/2017    Past Surgical History:  Procedure Laterality Date  . CARDIAC SURGERY    . KNEE SURGERY Right         Home Medications    Prior to Admission medications   Medication Sig Start Date End Date Taking? Authorizing Provider  amLODipine (NORVASC) 10 MG tablet Take 1 tablet (10 mg total) by mouth daily. Patient not taking: Reported on 02/13/2018 08/26/17   Petrucelli, Glynda Jaeger, PA-C  aspirin 81 MG chewable tablet Chew 1 tablet (81 mg total) by mouth daily. 03/09/17   Velvet Bathe, MD  atorvastatin (LIPITOR) 80 MG tablet Take 1 tablet (80 mg total) by mouth daily at 6 PM. Patient not taking: Reported on 02/13/2018 03/09/17   Velvet Bathe, MD  diphenhydrAMINE (BENADRYL) 25 MG tablet Take 1 tablet (25 mg total) by mouth  every 6 (six) hours as needed. 03/24/18   Montine Circle, PA-C  hydrOXYzine (ATARAX/VISTARIL) 25 MG tablet Take 1 tablet (25 mg total) by mouth every 6 (six) hours. 03/07/18   McDonald, Mia A, PA-C  lisinopril (PRINIVIL,ZESTRIL) 10 MG tablet Take 1 tablet (10 mg total) by mouth daily. 03/24/18   Montine Circle, PA-C  metoprolol tartrate (LOPRESSOR) 25 MG tablet Take 0.5 tablets (12.5 mg total) by mouth 2 (two) times daily. Patient not taking: Reported on 02/13/2018 08/26/17   Petrucelli, Samantha R, PA-C  ondansetron (ZOFRAN) 4 MG tablet Take 1 tablet (4 mg total) by mouth every 6 (six) hours. 03/07/18   McDonald, Mia A, PA-C  predniSONE (DELTASONE) 20 MG tablet Take 2 tablets (40 mg total) by mouth daily. 03/24/18   Montine Circle, PA-C  triamcinolone cream (KENALOG) 0.1 % Apply 1 application topically 2 (two) times daily. 03/07/18   McDonald, Mia A, PA-C    Family History Family History  Problem Relation Age of Onset  . Diabetes Mother   . CAD Father   . Testicular cancer Father   . CAD Other   . Stroke Other   . Brain cancer Other     Social History Social History   Tobacco Use  . Smoking status: Smoker, Current Status Unknown  . Smokeless tobacco: Never Used  Substance Use Topics  . Alcohol use: No  .  Drug use: Yes    Types: Cocaine     Allergies   Shellfish allergy and Penicillins   Review of Systems Review of Systems  Constitutional: Positive for fatigue and fever.  Respiratory: Positive for cough.   All other systems reviewed and are negative.    Physical Exam Updated Vital Signs BP (!) 171/98 (BP Location: Right Arm)   Pulse 71   Temp 97.9 F (36.6 C) (Oral)   Resp 14   Ht 6' (1.829 m)   Wt 95.3 kg (210 lb)   SpO2 100%   BMI 28.48 kg/m   Physical Exam  Constitutional: He is oriented to person, place, and time. He appears well-developed and well-nourished.  HENT:  Head: Normocephalic and atraumatic.  Mouth/Throat: Oropharynx is clear and moist.    Eyes: Pupils are equal, round, and reactive to light. Conjunctivae and EOM are normal.  Neck: Normal range of motion.  Cardiovascular: Normal rate, regular rhythm and normal heart sounds.  Pulmonary/Chest: Effort normal. No stridor. No respiratory distress. He has wheezes.  Abdominal: Soft. Bowel sounds are normal. There is no tenderness. There is no rebound.  Musculoskeletal: Normal range of motion.  Neurological: He is alert and oriented to person, place, and time.  Skin: Skin is warm and dry.  Psychiatric: He has a normal mood and affect.  Nursing note and vitals reviewed.    ED Treatments / Results  Labs (all labs ordered are listed, but only abnormal results are displayed) Labs Reviewed - No data to display  EKG None  Radiology No results found.  Procedures Procedures (including critical care time)  Medications Ordered in ED Medications  albuterol (PROVENTIL) (2.5 MG/3ML) 0.083% nebulizer solution 5 mg (5 mg Nebulization Given 04/21/18 0348)  ipratropium (ATROVENT) nebulizer solution 0.5 mg (0.5 mg Nebulization Given 04/21/18 0348)  albuterol (PROVENTIL HFA;VENTOLIN HFA) 108 (90 Base) MCG/ACT inhaler 2 puff (2 puffs Inhalation Given 04/21/18 3382)     Initial Impression / Assessment and Plan / ED Course  I have reviewed the triage vital signs and the nursing notes.  Pertinent labs & imaging results that were available during my care of the patient were reviewed by me and considered in my medical decision making (see chart for details).  47 year old male here with productive cough and subjective fever.  Sick contacts with similar.  He is afebrile and nontoxic in appearance here.  Exam is benign aside from some expiratory wheezes but he is in no acute respiratory distress.  Oxygen saturation is stable on room air.  Denies chest pain.  Given neb treatment here with resolution of wheezing.  Chest x-ray is clear.  Suspect viral process.  Will give albuterol inhaler here in ED  for home use.  Does not have PCP or insurance, will refer to patient care center for follow-up.  Discussed plan with patient, he acknowledged understanding and agreed with plan of care.  Return precautions given for new or worsening symptoms.  Final Clinical Impressions(s) / ED Diagnoses   Final diagnoses:  Viral URI with cough    ED Discharge Orders    None       Larene Pickett, PA-C 04/21/18 5053    Jola Schmidt, MD 04/21/18 2321

## 2018-04-21 NOTE — Discharge Instructions (Signed)
Use in the inhaler as needed. Can use over the counter cough medications to help. Follow-up with the patient care center-- call to make an appt. Return here for any new/acute changes.

## 2018-04-21 NOTE — ED Triage Notes (Signed)
Pt states there is a "bug" going around where he lives. Upper respiratory in nature. Endorses cough productive of yellow sputum, chills, general feeling of malaise. Afebrile in triage.

## 2018-04-28 ENCOUNTER — Encounter (HOSPITAL_COMMUNITY): Payer: Self-pay

## 2018-04-28 ENCOUNTER — Emergency Department (HOSPITAL_COMMUNITY)
Admission: EM | Admit: 2018-04-28 | Discharge: 2018-04-29 | Disposition: A | Discharge status: Home / Self Care | Payer: Self-pay | Attending: Emergency Medicine | Admitting: Emergency Medicine

## 2018-04-28 DIAGNOSIS — Z7982 Long term (current) use of aspirin: Secondary | ICD-10-CM | POA: Insufficient documentation

## 2018-04-28 DIAGNOSIS — F17200 Nicotine dependence, unspecified, uncomplicated: Secondary | ICD-10-CM | POA: Insufficient documentation

## 2018-04-28 DIAGNOSIS — I2510 Atherosclerotic heart disease of native coronary artery without angina pectoris: Secondary | ICD-10-CM | POA: Insufficient documentation

## 2018-04-28 DIAGNOSIS — R8281 Pyuria: Secondary | ICD-10-CM

## 2018-04-28 DIAGNOSIS — E860 Dehydration: Secondary | ICD-10-CM

## 2018-04-28 DIAGNOSIS — N390 Urinary tract infection, site not specified: Secondary | ICD-10-CM | POA: Insufficient documentation

## 2018-04-28 DIAGNOSIS — Z79899 Other long term (current) drug therapy: Secondary | ICD-10-CM | POA: Insufficient documentation

## 2018-04-28 DIAGNOSIS — I10 Essential (primary) hypertension: Secondary | ICD-10-CM | POA: Insufficient documentation

## 2018-04-28 LAB — URINALYSIS, ROUTINE W REFLEX MICROSCOPIC
BILIRUBIN URINE: NEGATIVE
Glucose, UA: NEGATIVE mg/dL
Hgb urine dipstick: NEGATIVE
Ketones, ur: 5 mg/dL — AB
Nitrite: NEGATIVE
PH: 5 (ref 5.0–8.0)
Protein, ur: NEGATIVE mg/dL
SPECIFIC GRAVITY, URINE: 1.025 (ref 1.005–1.030)

## 2018-04-28 LAB — CBC
HCT: 39 % (ref 39.0–52.0)
Hemoglobin: 12.6 g/dL — ABNORMAL LOW (ref 13.0–17.0)
MCH: 26.8 pg (ref 26.0–34.0)
MCHC: 32.3 g/dL (ref 30.0–36.0)
MCV: 83 fL (ref 78.0–100.0)
PLATELETS: 152 10*3/uL (ref 150–400)
RBC: 4.7 MIL/uL (ref 4.22–5.81)
RDW: 14.1 % (ref 11.5–15.5)
WBC: 6.9 10*3/uL (ref 4.0–10.5)

## 2018-04-28 NOTE — ED Triage Notes (Signed)
Pt states that he is getting over a "bug or the flu" just dont feel good, feels dehydrated, headache, reports not voiding like he is supposed to but denies dysuria, dry mouth. Denies pain.

## 2018-04-29 LAB — BASIC METABOLIC PANEL
ANION GAP: 9 (ref 5–15)
BUN: 9 mg/dL (ref 6–20)
CALCIUM: 9 mg/dL (ref 8.9–10.3)
CO2: 24 mmol/L (ref 22–32)
Chloride: 106 mmol/L (ref 98–111)
Creatinine, Ser: 1.21 mg/dL (ref 0.61–1.24)
Glucose, Bld: 103 mg/dL — ABNORMAL HIGH (ref 70–99)
Potassium: 3.5 mmol/L (ref 3.5–5.1)
SODIUM: 139 mmol/L (ref 135–145)

## 2018-04-29 MED ORDER — SODIUM CHLORIDE 0.9 % IV SOLN
1.0000 g | Freq: Once | INTRAVENOUS | Status: AC
  Administered 2018-04-29: 1 g via INTRAVENOUS
  Filled 2018-04-29: qty 10

## 2018-04-29 MED ORDER — AZITHROMYCIN 250 MG PO TABS
1000.0000 mg | ORAL_TABLET | Freq: Once | ORAL | Status: AC
  Administered 2018-04-29: 1000 mg via ORAL
  Filled 2018-04-29: qty 4

## 2018-04-29 MED ORDER — SODIUM CHLORIDE 0.9 % IV BOLUS
1000.0000 mL | Freq: Once | INTRAVENOUS | Status: AC
  Administered 2018-04-29: 1000 mL via INTRAVENOUS

## 2018-04-29 MED ORDER — IBUPROFEN 800 MG PO TABS
800.0000 mg | ORAL_TABLET | Freq: Once | ORAL | Status: AC
  Administered 2018-04-29: 800 mg via ORAL
  Filled 2018-04-29: qty 1

## 2018-04-29 MED ORDER — ACETAMINOPHEN 500 MG PO TABS
1000.0000 mg | ORAL_TABLET | Freq: Once | ORAL | Status: AC
  Administered 2018-04-29: 1000 mg via ORAL
  Filled 2018-04-29: qty 2

## 2018-04-29 NOTE — ED Provider Notes (Signed)
[facility] EMERGENCY DEPARTMENT Provider Note   CSN: [id number] Arrival date & time: 04/28/18  2256     History   Chief Complaint Chief Complaint  Patient presents with  . Headache    HPI Peter Madden is a 47 y.o. male.  Patient presents to the emergency department because he thinks that he is dehydrated.  He reports that he has been working outside in the heat, has been trying to drink but does not think he has been able to keep up.  He feels weak, has a mild diffuse headache.  He reports that he has not urinated much today and when he has it has been very dark.  No vomiting or diarrhea.     Past Medical History:  Diagnosis Date  . Coronary artery disease   . HTN (hypertension)   . Lymphoma [facility])     Patient Active Problem List   Diagnosis Date Noted  . 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 CAD (coronary artery disease) 03/07/2017  . Hypokalemia 03/07/2017  . Chest pain 03/07/2017  . Tobacco abuse 03/07/2017  . Essential hypertension 03/07/2017    Past Surgical History:  Procedure Laterality Date  . CARDIAC SURGERY    . KNEE SURGERY Right         Home Medications    Prior to Admission medications   Medication Sig Start Date End Date Taking? Authorizing Provider  amLODipine (NORVASC) 10 MG tablet Take 1 tablet (10 mg total) by mouth daily. Patient not taking: Reported on 02/13/2018 08/26/17   Petrucelli, Glynda Jaeger, PA-C  aspirin 81 MG chewable tablet Chew 1 tablet (81 mg total) by mouth daily. 03/09/17   Velvet Bathe, MD  atorvastatin (LIPITOR) 80 MG tablet Take 1 tablet (80 mg total) by mouth daily at 6 PM. Patient not taking: Reported on 02/13/2018 03/09/17   Velvet Bathe, MD  diphenhydrAMINE (BENADRYL) 25 MG tablet Take 1 tablet (25 mg total) by mouth every 6 (six) hours as needed. 03/24/18   Montine Circle, PA-C  hydrOXYzine (ATARAX/VISTARIL) 25 MG tablet Take 1 tablet (25 mg  total) by mouth every 6 (six) hours. 03/07/18   McDonald, Mia A, PA-C  lisinopril (PRINIVIL,ZESTRIL) 10 MG tablet Take 1 tablet (10 mg total) by mouth daily. 03/24/18   Montine Circle, PA-C  metoprolol tartrate (LOPRESSOR) 25 MG tablet Take 0.5 tablets (12.5 mg total) by mouth 2 (two) times daily. Patient not taking: Reported on 02/13/2018 08/26/17   Petrucelli, Samantha R, PA-C  ondansetron (ZOFRAN) 4 MG tablet Take 1 tablet (4 mg total) by mouth every 6 (six) hours. 03/07/18   McDonald, Mia A, PA-C  predniSONE (DELTASONE) 20 MG tablet Take 2 tablets (40 mg total) by mouth daily. 03/24/18   Montine Circle, PA-C  triamcinolone cream (KENALOG) 0.1 % Apply 1 application topically 2 (two) times daily. 03/07/18   McDonald, Mia A, PA-C    Family History Family History  Problem Relation Age of Onset  . Diabetes Mother   . CAD Father   . Testicular cancer Father   . CAD Other   . Stroke Other   . Brain cancer Other     Social History Social History   Tobacco Use  . Smoking status: Smoker, Current Status Unknown  . Smokeless tobacco: Never Used  Substance Use Topics  . Alcohol use: No  . Drug use: Yes    Types: Cocaine     Allergies   Shellfish allergy and Penicillins  Review of Systems Review of Systems  Constitutional: Positive for fatigue.  Neurological: Positive for headaches.  All other systems reviewed and are negative.    Physical Exam Updated Vital Signs BP (!) 159/103 (BP Location: Left Arm)   Pulse 75   Temp 98.1 F (36.7 C) (Oral)   Resp 14   Ht 6' (1.829 m)   Wt 90.7 kg (200 lb)   SpO2 98%   BMI 27.12 kg/m   Physical Exam  Constitutional: He is oriented to person, place, and time. He appears well-developed and well-nourished. No distress.  HENT:  Head: Normocephalic and atraumatic.  Right Ear: Hearing normal.  Left Ear: Hearing normal.  Nose: Nose normal.  Mouth/Throat: Oropharynx is clear and moist and mucous membranes are normal.  Eyes: Pupils are  equal, round, and reactive to light. Conjunctivae and EOM are normal.  Neck: Normal range of motion. Neck supple.  Cardiovascular: Regular rhythm, S1 normal and S2 normal. Exam reveals no gallop and no friction rub.  No murmur heard. Pulmonary/Chest: Effort normal and breath sounds normal. No respiratory distress. He exhibits no tenderness.  Abdominal: Soft. Normal appearance and bowel sounds are normal. There is no hepatosplenomegaly. There is no tenderness. There is no rebound, no guarding, no tenderness at McBurney's point and negative Murphy's sign. No hernia.  Musculoskeletal: Normal range of motion.  Neurological: He is alert and oriented to person, place, and time. He has normal strength. No cranial nerve deficit or sensory deficit. Coordination normal. GCS eye subscore is 4. GCS verbal subscore is 5. GCS motor subscore is 6.  Skin: Skin is warm, dry and intact. No rash noted. No cyanosis.  Psychiatric: He has a normal mood and affect. His speech is normal and behavior is normal. Thought content normal.  Nursing note and vitals reviewed.    ED Treatments / Results  Labs (all labs ordered are listed, but only abnormal results are displayed) Labs Reviewed  CBC - Abnormal; Notable for the following components:      Result Value   Hemoglobin 12.6 (*)    All other components within normal limits  BASIC METABOLIC PANEL - Abnormal; Notable for the following components:   Glucose, Bld 103 (*)    All other components within normal limits  URINALYSIS, ROUTINE W REFLEX MICROSCOPIC - Abnormal; Notable for the following components:   APPearance HAZY (*)    Ketones, ur 5 (*)    Leukocytes, UA TRACE (*)    Bacteria, UA RARE (*)    All other components within normal limits  GC/CHLAMYDIA PROBE AMP ([place]) NOT AT [facility]    EKG None  Radiology No results found.  Procedures Procedures (including critical care time)  Medications Ordered in ED Medications  sodium chloride 0.9 %  bolus 1,000 mL (0 mLs Intravenous Stopped 04/29/18 0300)  acetaminophen (TYLENOL) tablet 1,000 mg (1,000 mg Oral Given 04/29/18 0403)  ibuprofen (ADVIL,MOTRIN) tablet 800 mg (800 mg Oral Given 04/29/18 0403)  sodium chloride 0.9 % bolus 1,000 mL (0 mLs Intravenous Stopped 04/29/18 0513)     Initial Impression / Assessment and Plan / ED Course  I have reviewed the triage vital signs and the nursing notes.  Pertinent labs & imaging results that were available during my care of the patient were reviewed by me and considered in my medical decision making (see chart for details).     Patient presents to the emergency department for evaluation of possible dehydration.  Patient reports that he has been working outside  in the heat and not drinking enough.  He has noticed decreased urine output.  He feels weak and fatigued.  His examination is unremarkable.  He appears well.  Vital signs are normal other than mild hypertension.  Blood work is normal.  Urinalysis does not suggest severe dehydration.  There are some white cells present with rare bacteria, this has been seen before in this patient.  When he was in the ER month ago his urinalysis looked similar.  It is not clear what the significance if this is.  Will culture the urine to evaluate for possible infection.  Additionally, will perform GC and chlamydia on urine to evaluate for possible chlamydia/gonorrhea causing pyuria.  Empiric administration of Rocephin and Zithromax.  Final Clinical Impressions(s) / ED Diagnoses   Final diagnoses:  Dehydration  Pyuria    ED Discharge Orders    None       Orpah Greek, MD 04/29/18 [phone]

## 2018-04-29 NOTE — ED Notes (Signed)
ED Provider at bedside. 

## 2018-05-01 ENCOUNTER — Emergency Department (HOSPITAL_COMMUNITY)
Admission: EM | Admit: 2018-05-01 | Discharge: 2018-05-01 | Disposition: A | Discharge status: Home / Self Care | Payer: Self-pay | Attending: Emergency Medicine | Admitting: Emergency Medicine

## 2018-05-01 ENCOUNTER — Other Ambulatory Visit: Payer: Self-pay

## 2018-05-01 DIAGNOSIS — Z79899 Other long term (current) drug therapy: Secondary | ICD-10-CM | POA: Insufficient documentation

## 2018-05-01 DIAGNOSIS — F17200 Nicotine dependence, unspecified, uncomplicated: Secondary | ICD-10-CM | POA: Insufficient documentation

## 2018-05-01 DIAGNOSIS — I10 Essential (primary) hypertension: Secondary | ICD-10-CM | POA: Insufficient documentation

## 2018-05-01 DIAGNOSIS — I2510 Atherosclerotic heart disease of native coronary artery without angina pectoris: Secondary | ICD-10-CM | POA: Insufficient documentation

## 2018-05-01 DIAGNOSIS — Z7982 Long term (current) use of aspirin: Secondary | ICD-10-CM | POA: Insufficient documentation

## 2018-05-01 DIAGNOSIS — Z711 Person with feared health complaint in whom no diagnosis is made: Secondary | ICD-10-CM | POA: Insufficient documentation

## 2018-05-01 LAB — COMPREHENSIVE METABOLIC PANEL
ALK PHOS: 81 U/L (ref 38–126)
ALT: 25 U/L (ref 0–44)
AST: 25 U/L (ref 15–41)
Albumin: 3.7 g/dL (ref 3.5–5.0)
Anion gap: 10 (ref 5–15)
BUN: 9 mg/dL (ref 6–20)
CALCIUM: 9.2 mg/dL (ref 8.9–10.3)
CHLORIDE: 102 mmol/L (ref 98–111)
CO2: 28 mmol/L (ref 22–32)
CREATININE: 1.27 mg/dL — AB (ref 0.61–1.24)
Glucose, Bld: 101 mg/dL — ABNORMAL HIGH (ref 70–99)
Potassium: 3.8 mmol/L (ref 3.5–5.1)
Sodium: 140 mmol/L (ref 135–145)
Total Bilirubin: 0.6 mg/dL (ref 0.3–1.2)
Total Protein: 6.5 g/dL (ref 6.5–8.1)

## 2018-05-01 LAB — URINALYSIS, ROUTINE W REFLEX MICROSCOPIC
Bilirubin Urine: NEGATIVE
GLUCOSE, UA: NEGATIVE mg/dL
Hgb urine dipstick: NEGATIVE
KETONES UR: NEGATIVE mg/dL
Leukocytes, UA: NEGATIVE
Nitrite: NEGATIVE
PROTEIN: NEGATIVE mg/dL
Specific Gravity, Urine: 1.019 (ref 1.005–1.030)
pH: 6 (ref 5.0–8.0)

## 2018-05-01 LAB — CBC
HCT: 40.7 % (ref 39.0–52.0)
Hemoglobin: 13.2 g/dL (ref 13.0–17.0)
MCH: 27 pg (ref 26.0–34.0)
MCHC: 32.4 g/dL (ref 30.0–36.0)
MCV: 83.2 fL (ref 78.0–100.0)
PLATELETS: 172 10*3/uL (ref 150–400)
RBC: 4.89 MIL/uL (ref 4.22–5.81)
RDW: 14.3 % (ref 11.5–15.5)
WBC: 7.3 10*3/uL (ref 4.0–10.5)

## 2018-05-01 NOTE — ED Provider Notes (Signed)
[facility] EMERGENCY DEPARTMENT Provider Note   CSN: [id number] Arrival date & time: 05/01/18  0041     History   Chief Complaint Chief Complaint  Patient presents with  . Dysuria    HPI Peter Madden is a 47 y.o. male.  The history is provided by the patient.  patient presents because he thinks he is dehydrated.  He reports his urine is dark.  He reports his mouth feels dry.  He has no other complaints at this time. He was seen in the emergency department recently for the similar symptoms  Past Medical History:  Diagnosis Date  . Coronary artery disease   . HTN (hypertension)   . Lymphoma [facility])     Patient Active Problem List   Diagnosis Date Noted  . 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 CAD (coronary artery disease) 03/07/2017  . Hypokalemia 03/07/2017  . Chest pain 03/07/2017  . Tobacco abuse 03/07/2017  . Essential hypertension 03/07/2017    Past Surgical History:  Procedure Laterality Date  . CARDIAC SURGERY    . KNEE SURGERY Right         Home Medications    Prior to Admission medications   Medication Sig Start Date End Date Taking? Authorizing Provider  amLODipine (NORVASC) 10 MG tablet Take 1 tablet (10 mg total) by mouth daily. Patient not taking: Reported on 02/13/2018 08/26/17   Petrucelli, Glynda Jaeger, PA-C  aspirin 81 MG chewable tablet Chew 1 tablet (81 mg total) by mouth daily. 03/09/17   Velvet Bathe, MD  atorvastatin (LIPITOR) 80 MG tablet Take 1 tablet (80 mg total) by mouth daily at 6 PM. Patient not taking: Reported on 02/13/2018 03/09/17   Velvet Bathe, MD  diphenhydrAMINE (BENADRYL) 25 MG tablet Take 1 tablet (25 mg total) by mouth every 6 (six) hours as needed. 03/24/18   Montine Circle, PA-C  hydrOXYzine (ATARAX/VISTARIL) 25 MG tablet Take 1 tablet (25 mg total) by mouth every 6 (six) hours. 03/07/18   McDonald, Mia A, PA-C  lisinopril (PRINIVIL,ZESTRIL) 10 MG tablet  Take 1 tablet (10 mg total) by mouth daily. 03/24/18   Montine Circle, PA-C  metoprolol tartrate (LOPRESSOR) 25 MG tablet Take 0.5 tablets (12.5 mg total) by mouth 2 (two) times daily. Patient not taking: Reported on 02/13/2018 08/26/17   Petrucelli, Samantha R, PA-C  ondansetron (ZOFRAN) 4 MG tablet Take 1 tablet (4 mg total) by mouth every 6 (six) hours. 03/07/18   McDonald, Mia A, PA-C  predniSONE (DELTASONE) 20 MG tablet Take 2 tablets (40 mg total) by mouth daily. 03/24/18   Montine Circle, PA-C  triamcinolone cream (KENALOG) 0.1 % Apply 1 application topically 2 (two) times daily. 03/07/18   McDonald, Mia A, PA-C    Family History Family History  Problem Relation Age of Onset  . Diabetes Mother   . CAD Father   . Testicular cancer Father   . CAD Other   . Stroke Other   . Brain cancer Other     Social History Social History   Tobacco Use  . Smoking status: Smoker, Current Status Unknown  . Smokeless tobacco: Never Used  Substance Use Topics  . Alcohol use: No  . Drug use: Yes    Types: Cocaine     Allergies   Shellfish allergy and Penicillins   Review of Systems Review of Systems  Constitutional: Positive for fatigue. Negative for fever.  Gastrointestinal: Negative for vomiting.     Physical Exam  Updated Vital Signs BP (!) 184/94 (BP Location: Right Arm)   Pulse 60   Temp (!) 97.5 F (36.4 C) (Oral)   Resp 16   Ht 1.829 m (6')   Wt 90.7 kg (200 lb)   SpO2 96%   BMI 27.12 kg/m   Physical Exam  CONSTITUTIONAL: Well developed/well nourished, sleeping on my arrival to the room HEAD: Normocephalic/atraumatic EYES: EOMI/PERRL ENMT: Mucous membranes moist NECK: supple no meningeal signs CV: S1/S2 noted, no murmurs/rubs/gallops noted LUNGS: Lungs are clear to auscultation bilaterally, no apparent distress ABDOMEN: soft, nontender NEURO: Pt is sleeping but easily arousable, he is appropriate moves all extremitiesx4.  EXTREMITIES: pulses normal/equal, full  ROM SKIN: warm, color normal   ED Treatments / Results  Labs (all labs ordered are listed, but only abnormal results are displayed) Labs Reviewed  COMPREHENSIVE METABOLIC PANEL - Abnormal; Notable for the following components:      Result Value   Glucose, Bld 101 (*)    Creatinine, Ser 1.27 (*)    All other components within normal limits  CBC  URINALYSIS, ROUTINE W REFLEX MICROSCOPIC    EKG None  Radiology No results found.  Procedures Procedures (including critical care time)  Medications Ordered in ED Medications - No data to display   Initial Impression / Assessment and Plan / ED Course  I have reviewed the triage vital signs and the nursing notes.  Pertinent labs esults that were available during my care of the patient were reviewed by me and considered in my medical decision making (see chart for details).     When I went to examine patient he was asleep.  When I woke him up he does report he felt he was dehydrated without any other complaints.  Labs and urinalysis reassuring.  He was recently seen in the emergency department and had a evaluation and treated for potential STI.  When I advised him of his lab results, he felt improved and patient was appropriate for discharge. Note, this is patient's 13th ER visit in 6 months.  Final Clinical Impressions(s) / ED Diagnoses   Final diagnoses:  Worried well    ED Discharge Orders    None       Ripley Fraise, MD 05/01/18 [phone]

## 2018-05-01 NOTE — Discharge Instructions (Addendum)

## 2018-05-01 NOTE — ED Triage Notes (Signed)
Patient c/o problems with urine "real thick, real dark". Was here couple of nights ago for same.

## 2018-05-01 NOTE — ED Notes (Signed)
ED Provider at bedside. 

## 2018-06-06 ENCOUNTER — Emergency Department (HOSPITAL_COMMUNITY): Payer: Self-pay

## 2018-06-06 ENCOUNTER — Emergency Department (HOSPITAL_COMMUNITY)
Admission: EM | Admit: 2018-06-06 | Discharge: 2018-06-06 | Disposition: A | Discharge status: Home / Self Care | Payer: Self-pay | Attending: Emergency Medicine | Admitting: Emergency Medicine

## 2018-06-06 ENCOUNTER — Encounter (HOSPITAL_COMMUNITY): Payer: Self-pay | Admitting: Emergency Medicine

## 2018-06-06 DIAGNOSIS — S0242XA Fracture of alveolus of maxilla, initial encounter for closed fracture: Secondary | ICD-10-CM | POA: Insufficient documentation

## 2018-06-06 DIAGNOSIS — I10 Essential (primary) hypertension: Secondary | ICD-10-CM | POA: Insufficient documentation

## 2018-06-06 DIAGNOSIS — I2510 Atherosclerotic heart disease of native coronary artery without angina pectoris: Secondary | ICD-10-CM | POA: Insufficient documentation

## 2018-06-06 DIAGNOSIS — S0280XA Fracture of other specified skull and facial bones, unspecified side, initial encounter for closed fracture: Secondary | ICD-10-CM

## 2018-06-06 DIAGNOSIS — Y939 Activity, unspecified: Secondary | ICD-10-CM | POA: Insufficient documentation

## 2018-06-06 DIAGNOSIS — S02401A Maxillary fracture, unspecified, initial encounter for closed fracture: Secondary | ICD-10-CM

## 2018-06-06 DIAGNOSIS — F17210 Nicotine dependence, cigarettes, uncomplicated: Secondary | ICD-10-CM | POA: Insufficient documentation

## 2018-06-06 DIAGNOSIS — S0219XA Other fracture of base of skull, initial encounter for closed fracture: Secondary | ICD-10-CM | POA: Insufficient documentation

## 2018-06-06 DIAGNOSIS — S0240EA Zygomatic fracture, right side, initial encounter for closed fracture: Secondary | ICD-10-CM | POA: Insufficient documentation

## 2018-06-06 DIAGNOSIS — Y999 Unspecified external cause status: Secondary | ICD-10-CM | POA: Insufficient documentation

## 2018-06-06 DIAGNOSIS — Y929 Unspecified place or not applicable: Secondary | ICD-10-CM | POA: Insufficient documentation

## 2018-06-06 DIAGNOSIS — S02402A Zygomatic fracture, unspecified, initial encounter for closed fracture: Secondary | ICD-10-CM

## 2018-06-06 DIAGNOSIS — S0230XA Fracture of orbital floor, unspecified side, initial encounter for closed fracture: Secondary | ICD-10-CM | POA: Insufficient documentation

## 2018-06-06 MED ORDER — ONDANSETRON HCL 4 MG PO TABS: 4.0000 mg | ORAL_TABLET | Freq: Four times a day (QID) | ORAL | 0 refills | Status: AC

## 2018-06-06 MED ORDER — HYDROMORPHONE HCL 1 MG/ML IJ SOLN
1.0000 mg | Freq: Once | INTRAMUSCULAR | Status: AC
  Administered 2018-06-06: 1 mg via INTRAVENOUS
  Filled 2018-06-06: qty 1

## 2018-06-06 MED ORDER — ONDANSETRON HCL 4 MG/2ML IJ SOLN
4.0000 mg | Freq: Once | INTRAMUSCULAR | Status: AC
  Administered 2018-06-06: 4 mg via INTRAVENOUS
  Filled 2018-06-06: qty 2

## 2018-06-06 MED ORDER — HYDROMORPHONE HCL 2 MG PO TABS: 4.0000 mg | ORAL_TABLET | Freq: Once | ORAL | Status: DC

## 2018-06-06 MED ORDER — CYCLOBENZAPRINE HCL 10 MG PO TABS: 10.0000 mg | ORAL_TABLET | Freq: Once | ORAL | Status: DC

## 2018-06-06 MED ORDER — CLONIDINE HCL 0.2 MG PO TABS
0.2000 mg | ORAL_TABLET | Freq: Once | ORAL | Status: AC
  Administered 2018-06-06: 0.2 mg via ORAL
  Filled 2018-06-06: qty 1

## 2018-06-06 MED ORDER — OXYCODONE-ACETAMINOPHEN 5-325 MG PO TABS: 1.0000 | ORAL_TABLET | ORAL | 0 refills | Status: AC
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
## 2018-06-06 NOTE — Discharge Instructions (Addendum)
You have fractures of your skull and around your eye. The skull fracture will heal on its own over time.  For the eye and face fractures, please follow the following instructions: Call the ENT doctor, Dr. Blenda Nicely, first thing on Monday 06/08/18. Her number is listed below. Eat soft foods until you can be seen by the ENT. No nose blowing.  I have prescribed you pain medication to take as well as some nausea medication if you get an upset stomach.  If you need assistance with follow-up or getting your medications refilled, you can return to the emergency room tomorrow to speak with the social worker about assistance.  Also, your blood pressure today was high. It is important that you follow-up with a primary care or family doctor to talk about this and get back on medications.  I am sorry that this happened to you tonight. I wish you a speedy recovery.
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
## 2018-06-06 NOTE — ED Notes (Signed)
Patient transported to CT 

## 2018-06-06 NOTE — ED Notes (Signed)
Paged ENT trauma/[facility]

## 2018-07-04 IMAGING — CR DG CHEST 1V PORT
1 series · 1 of 1 positions shown · non-contrast
Comparison: No prior.

CLINICAL DATA: Chest pain.

EXAM:
PORTABLE CHEST 1 VIEW

[AP]
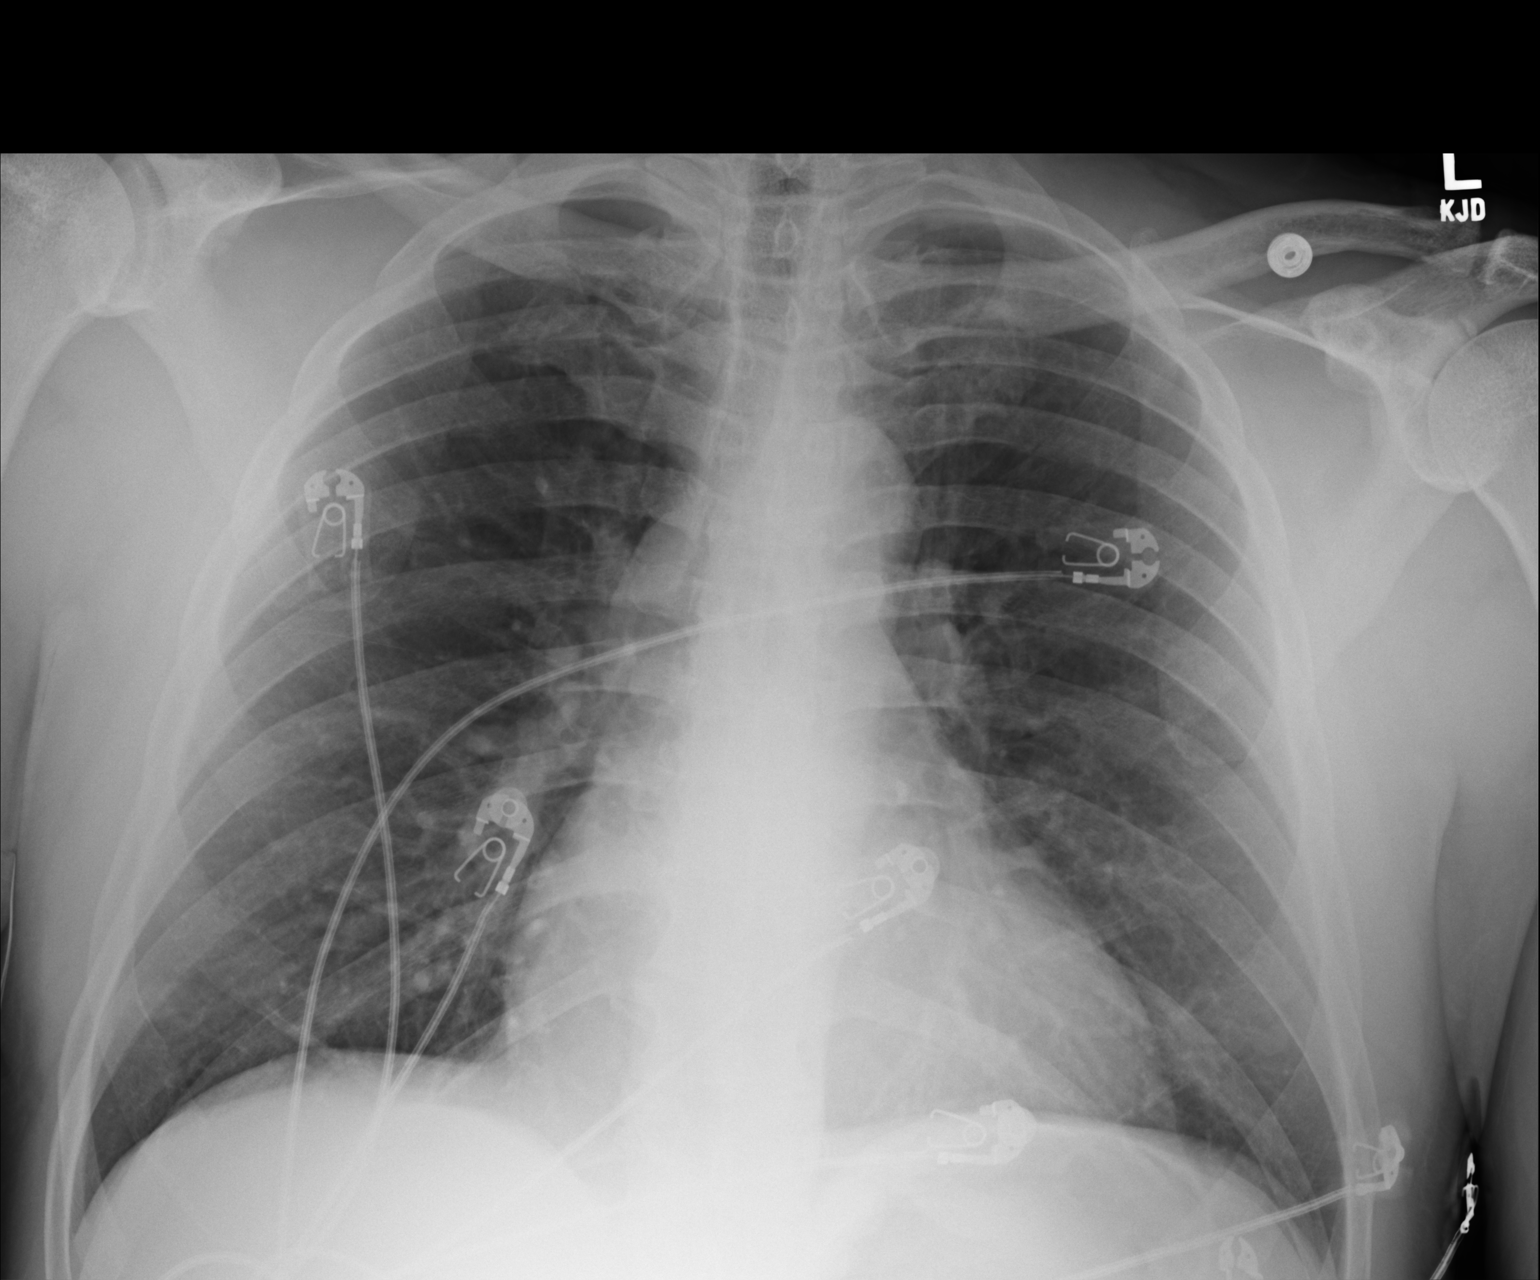

[1 of 1 positions shown; findings below may reference images not displayed]

FINDINGS: Thoracic aorta is tortuous. Mild cardiomegaly. Normal pulmonary
vascularity. No focal infiltrate. No pleural effusion or
pneumothorax. Left costophrenic angle is not completely imaged. No
acute bony abnormality.
IMPRESSION: Thoracic aorta is slightly tortuous. Borderline cardiomegaly. No
pulmonary venous congestion. No acute pulmonary disease.

## 2019-05-29 IMAGING — DX DG KNEE COMPLETE 4+V*R*
4 series · 4 of 4 positions shown · non-contrast
Comparison: None.

CLINICAL DATA: Fall with injury to the right knee

EXAM:
RIGHT KNEE - COMPLETE 4+ VIEW

[t knee ap right]
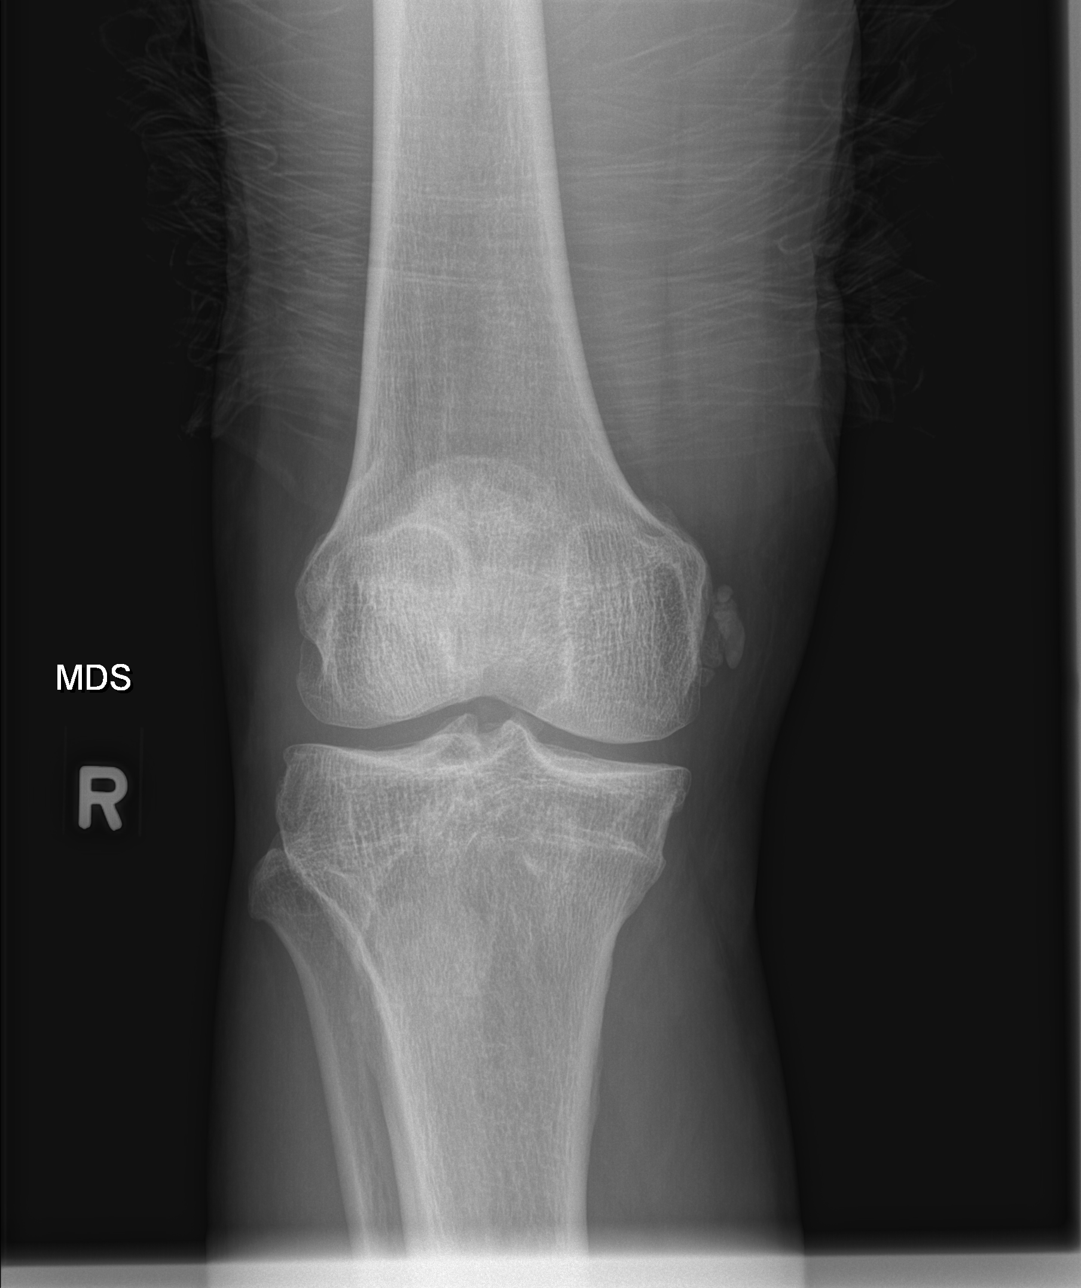

[t knee obl right (1 of 2)]
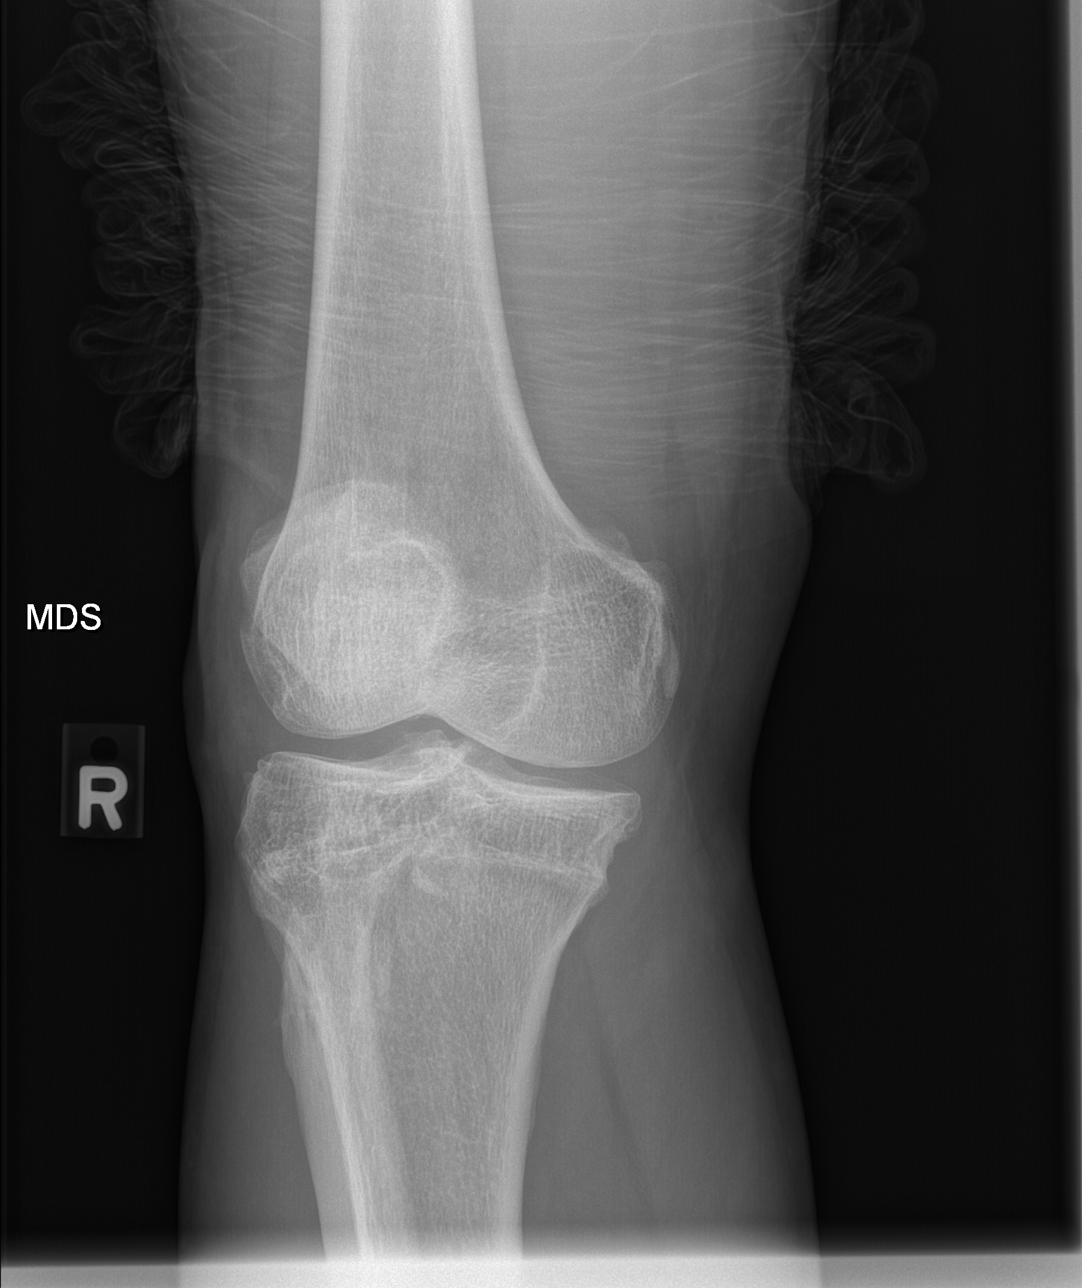

[t knee obl right (2 of 2)]
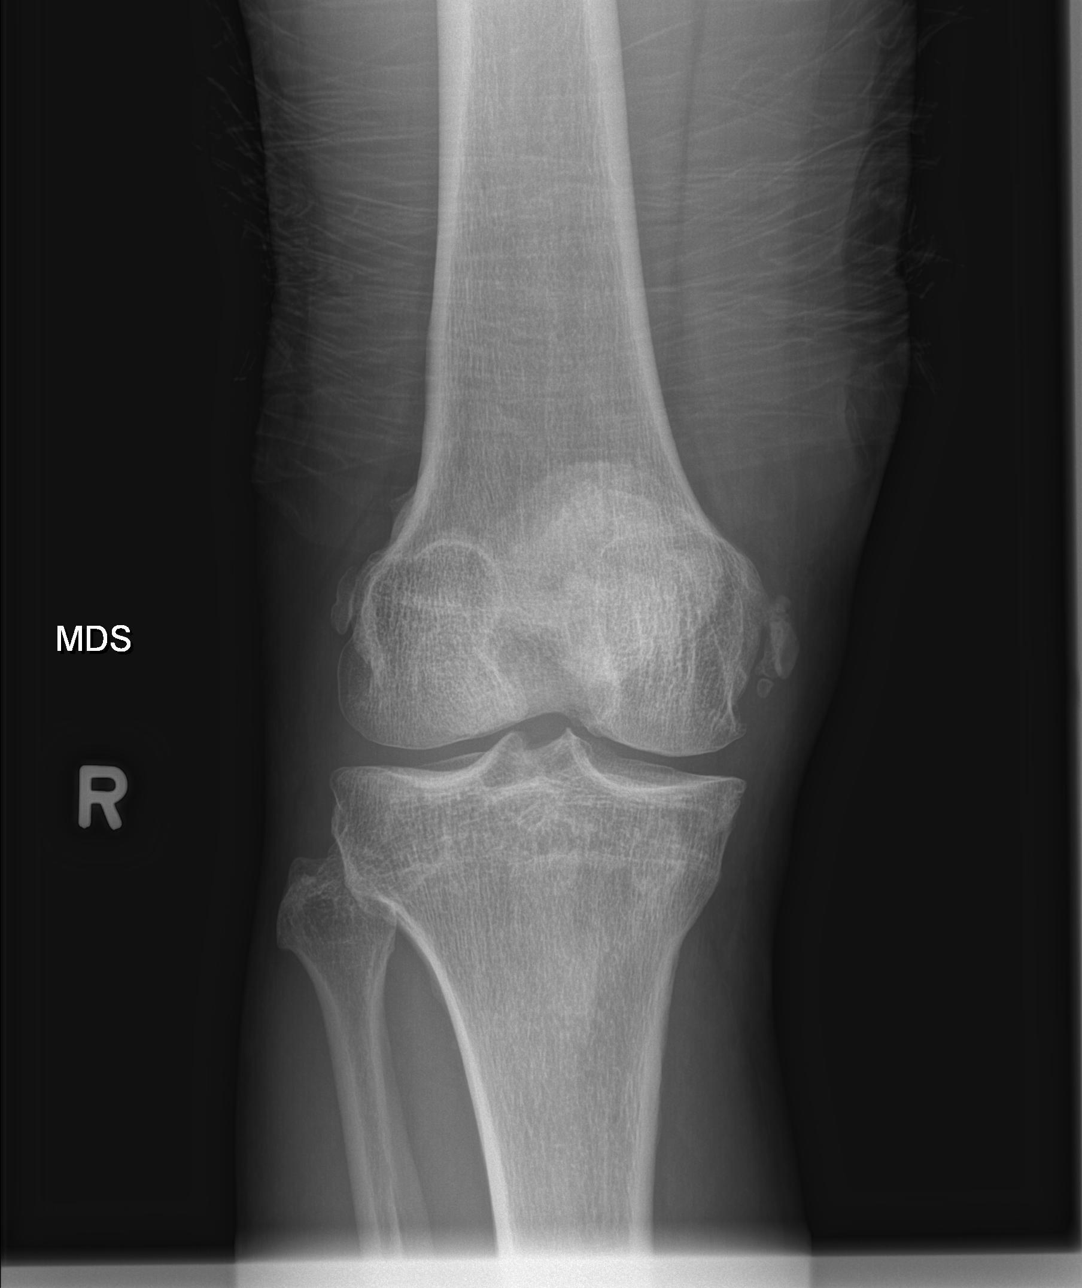

[t knee lat right]
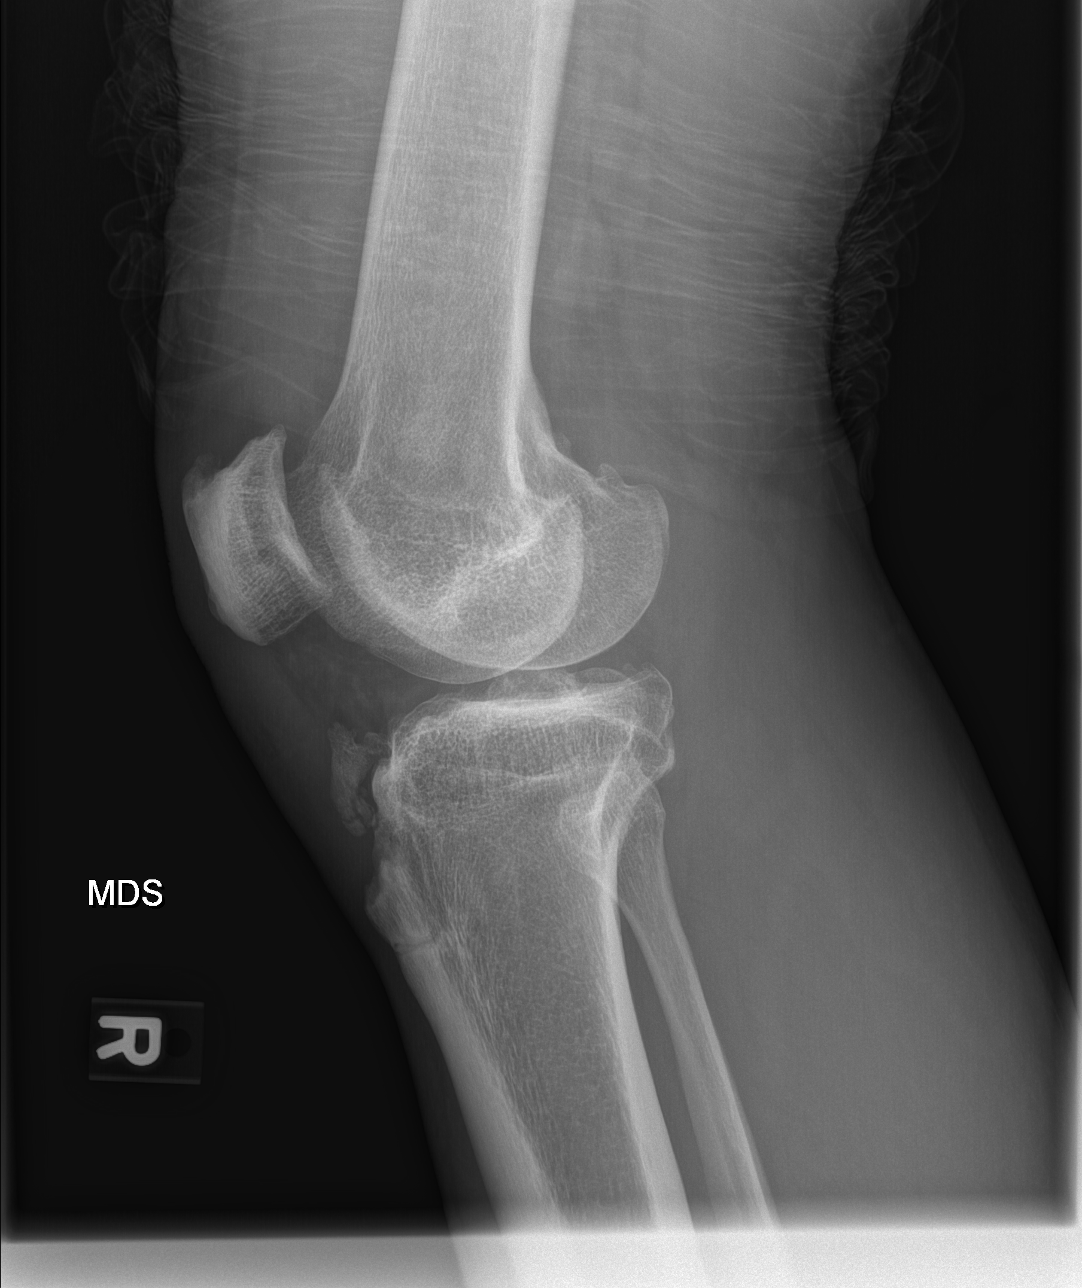

[4 of 4 positions shown; findings below may reference images not displayed]

FINDINGS: No definite acute displaced fracture or malalignment. Mild
degenerative changes of the medial, lateral and patellofemoral
compartments with small knee effusion. Chunky calcifications along
the medial and lateral femoral condyles, suspect prior ligamentous
injury.
IMPRESSION: 1. No definite acute osseous abnormality.
2. Mild arthritis with small knee effusion

## 2019-08-18 IMAGING — DX DG CHEST 2V
2 series · 2 of 2 positions shown · non-contrast
Comparison: 02/05/2018

CLINICAL DATA: Cough fever and wheezing

EXAM:
CHEST - 2 VIEW

[x chest ap]
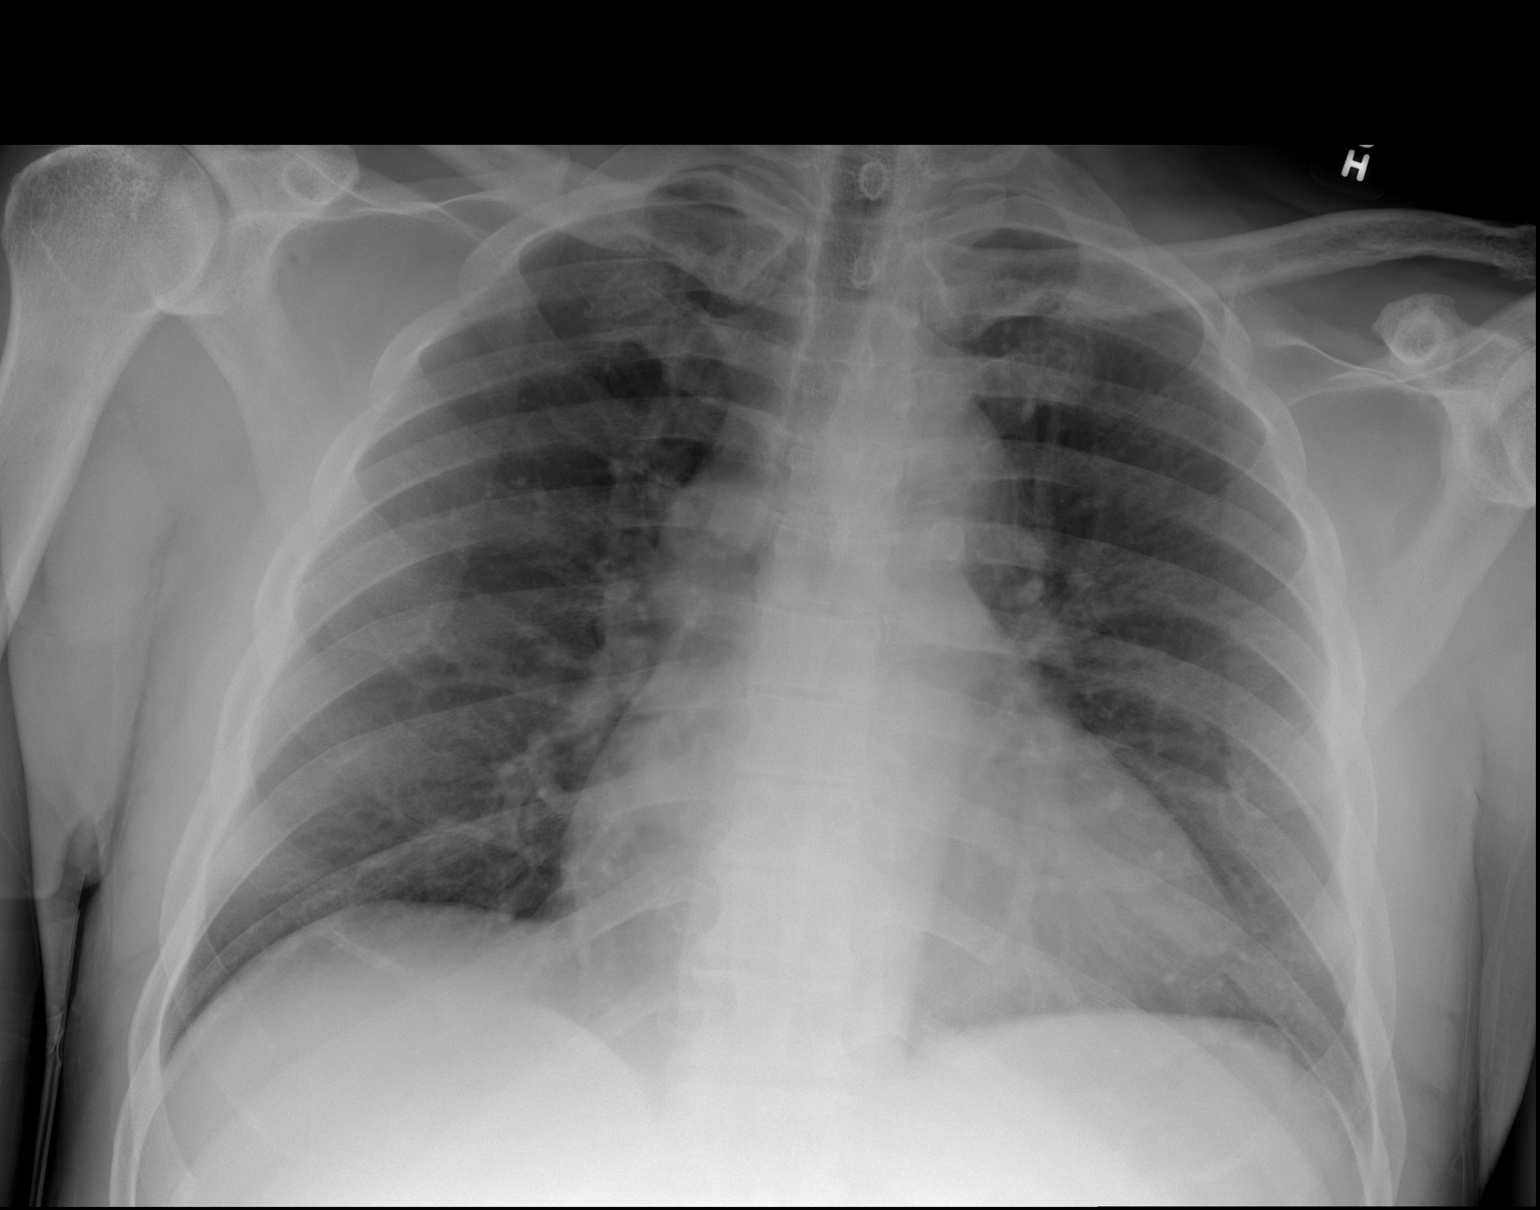

[w chest lat]
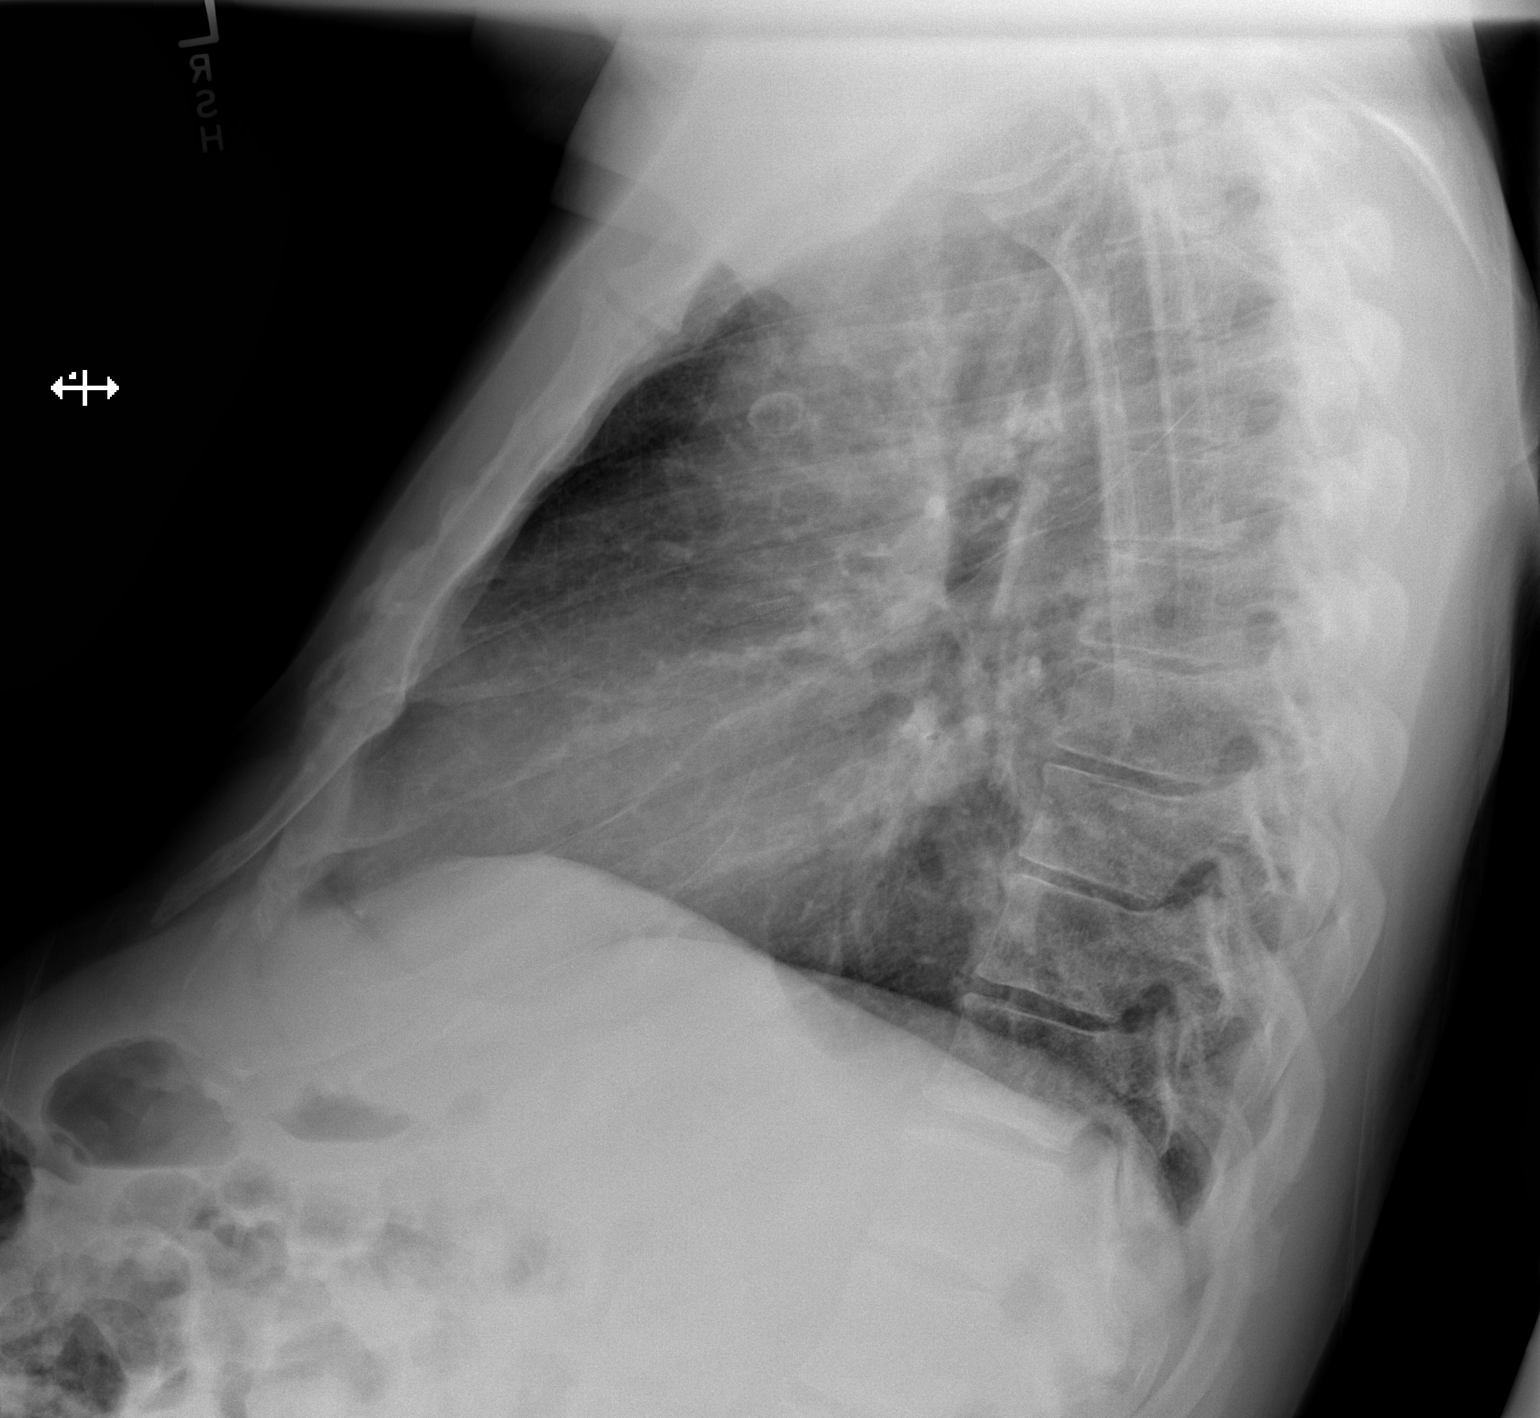

[2 of 2 positions shown; findings below may reference images not displayed]

FINDINGS: Borderline cardiomegaly. No acute airspace disease or effusion. No
pneumothorax.
IMPRESSION: No active cardiopulmonary disease.  Borderline cardiomegaly
